# Patient Record
Sex: Female | Born: 1940 | Race: White | Hispanic: No | Marital: Married | Smoking: Former smoker
Health system: Southern US, Community
[De-identification: ages and names within clinical notes are randomized; demographics above are authoritative.]

## PROBLEM LIST (undated history)

## (undated) DIAGNOSIS — R93429 Abnormal radiologic findings on diagnostic imaging of unspecified kidney: Secondary | ICD-10-CM

## (undated) DIAGNOSIS — I214 Non-ST elevation (NSTEMI) myocardial infarction: Secondary | ICD-10-CM

## (undated) DIAGNOSIS — I3139 Other pericardial effusion (noninflammatory): Secondary | ICD-10-CM

## (undated) DIAGNOSIS — R7989 Other specified abnormal findings of blood chemistry: Secondary | ICD-10-CM

## (undated) DIAGNOSIS — I313 Pericardial effusion (noninflammatory): Secondary | ICD-10-CM

## (undated) DIAGNOSIS — I10 Essential (primary) hypertension: Secondary | ICD-10-CM

## (undated) DIAGNOSIS — Q2381 Bicuspid aortic valve: Secondary | ICD-10-CM

## (undated) DIAGNOSIS — Q231 Congenital insufficiency of aortic valve: Secondary | ICD-10-CM

## (undated) SURGERY — CORONARY ANGIOGRAM
Anesthesia: Moderate Sedation | Laterality: Left

---

## 2007-08-09 HISTORY — PX: CARDIAC CATHETERIZATION: SHX172

## 2008-02-01 ENCOUNTER — Encounter: Admission: RE | Admit: 2008-02-01 | Discharge: 2008-02-01 | Payer: Self-pay | Admitting: Obstetrics and Gynecology

## 2010-08-08 HISTORY — PX: REPLACEMENT TOTAL KNEE: SUR1224

## 2010-08-23 LAB — BASIC METABOLIC PANEL
BUN: 11 mg/dL (ref 6–23)
CO2: 29 mEq/L (ref 19–32)
Calcium: 9.3 mg/dL (ref 8.4–10.5)
Chloride: 107 mEq/L (ref 96–112)
Creatinine, Ser: 0.83 mg/dL (ref 0.4–1.2)
GFR calc Af Amer: >60 mL/min (ref >60)
GFR calc non Af Amer: >60 mL/min (ref >60)
Glucose, Bld: 87 mg/dL (ref 70–99)
Potassium: 4.6 mEq/L (ref 3.5–5.1)
Sodium: 143 mEq/L (ref 135–145)

## 2010-08-23 LAB — CBC
HCT: 42 % (ref 36.0–46.0)
Hemoglobin: 14.2 g/dL (ref 12.0–15.0)
MCH: 30.6 pg (ref 26.0–34.0)
MCHC: 33.8 g/dL (ref 30.0–36.0)
MCV: 90.5 fL (ref 78.0–100.0)
Platelets: 175 10*3/uL (ref 150–400)
RBC: 4.64 MIL/uL (ref 3.87–5.11)
RDW: 13.2 % (ref 11.5–15.5)
WBC: 9.3 10*3/uL (ref 4.0–10.5)

## 2010-08-23 LAB — SURGICAL PCR SCREEN
MRSA, PCR: NEGATIVE
Staphylococcus aureus: NEGATIVE

## 2010-08-23 LAB — APTT: aPTT: 26 seconds (ref 24–37)

## 2010-08-23 LAB — PROTIME-INR
INR: 0.99 (ref 0.00–1.49)
Prothrombin Time: 13.3 seconds (ref 11.6–15.2)

## 2010-08-24 ENCOUNTER — Inpatient Hospital Stay (HOSPITAL_COMMUNITY)
Admission: RE | Admit: 2010-08-24 | Discharge: 2010-08-28 | Payer: Self-pay | Source: Home / Self Care | Attending: Orthopaedic Surgery | Admitting: Orthopaedic Surgery

## 2010-08-30 LAB — CBC
HCT: 36.2 % (ref 36.0–46.0)
HCT: 33.6 % — ABNORMAL LOW (ref 36.0–46.0)
HCT: 34.2 % — ABNORMAL LOW (ref 36.0–46.0)
Hemoglobin: 12 g/dL (ref 12.0–15.0)
Hemoglobin: 11.1 g/dL — ABNORMAL LOW (ref 12.0–15.0)
Hemoglobin: 11.3 g/dL — ABNORMAL LOW (ref 12.0–15.0)
MCH: 29.8 pg (ref 26.0–34.0)
MCH: 29.4 pg (ref 26.0–34.0)
MCH: 29.4 pg (ref 26.0–34.0)
MCHC: 33.1 g/dL (ref 30.0–36.0)
MCHC: 33 g/dL (ref 30.0–36.0)
MCHC: 33 g/dL (ref 30.0–36.0)
MCV: 89.8 fL (ref 78.0–100.0)
MCV: 89.1 fL (ref 78.0–100.0)
MCV: 89.1 fL (ref 78.0–100.0)
Platelets: 140 10*3/uL — ABNORMAL LOW (ref 150–400)
Platelets: 146 10*3/uL — ABNORMAL LOW (ref 150–400)
Platelets: 155 10*3/uL (ref 150–400)
RBC: 4.03 MIL/uL (ref 3.87–5.11)
RBC: 3.77 MIL/uL — ABNORMAL LOW (ref 3.87–5.11)
RBC: 3.84 MIL/uL — ABNORMAL LOW (ref 3.87–5.11)
RDW: 13.4 % (ref 11.5–15.5)
RDW: 13.5 % (ref 11.5–15.5)
RDW: 13.4 % (ref 11.5–15.5)
WBC: 9.5 10*3/uL (ref 4.0–10.5)
WBC: 10.5 10*3/uL (ref 4.0–10.5)
WBC: 9.9 10*3/uL (ref 4.0–10.5)

## 2010-08-30 LAB — BASIC METABOLIC PANEL
BUN: 7 mg/dL (ref 6–23)
BUN: 13 mg/dL (ref 6–23)
BUN: 10 mg/dL (ref 6–23)
CO2: 28 mEq/L (ref 19–32)
CO2: 28 mEq/L (ref 19–32)
CO2: 29 mEq/L (ref 19–32)
Calcium: 8.2 mg/dL — ABNORMAL LOW (ref 8.4–10.5)
Calcium: 8.3 mg/dL — ABNORMAL LOW (ref 8.4–10.5)
Calcium: 8.1 mg/dL — ABNORMAL LOW (ref 8.4–10.5)
Chloride: 103 mEq/L (ref 96–112)
Chloride: 102 mEq/L (ref 96–112)
Chloride: 103 mEq/L (ref 96–112)
Creatinine, Ser: 0.79 mg/dL (ref 0.4–1.2)
Creatinine, Ser: 0.91 mg/dL (ref 0.4–1.2)
Creatinine, Ser: 0.72 mg/dL (ref 0.4–1.2)
GFR calc Af Amer: >60 mL/min (ref >60)
GFR calc Af Amer: >60 mL/min (ref >60)
GFR calc Af Amer: >60 mL/min (ref >60)
GFR calc non Af Amer: >60 mL/min (ref >60)
GFR calc non Af Amer: >60 mL/min (ref >60)
GFR calc non Af Amer: >60 mL/min (ref >60)
Glucose, Bld: 117 mg/dL — ABNORMAL HIGH (ref 70–99)
Glucose, Bld: 104 mg/dL — ABNORMAL HIGH (ref 70–99)
Glucose, Bld: 102 mg/dL — ABNORMAL HIGH (ref 70–99)
Potassium: 3.8 mEq/L (ref 3.5–5.1)
Potassium: 3.6 mEq/L (ref 3.5–5.1)
Potassium: 3.8 mEq/L (ref 3.5–5.1)
Sodium: 136 mEq/L (ref 135–145)
Sodium: 136 mEq/L (ref 135–145)
Sodium: 138 mEq/L (ref 135–145)

## 2010-08-30 LAB — PROTIME-INR
INR: 1.16 (ref 0.00–1.49)
INR: 2.31 — ABNORMAL HIGH (ref 0.00–1.49)
INR: 2.53 — ABNORMAL HIGH (ref 0.00–1.49)
Prothrombin Time: 15 seconds (ref 11.6–15.2)
Prothrombin Time: 25.5 seconds — ABNORMAL HIGH (ref 11.6–15.2)
Prothrombin Time: 27.4 seconds — ABNORMAL HIGH (ref 11.6–15.2)

## 2010-08-31 LAB — PROTIME-INR
INR: 2.1 — ABNORMAL HIGH (ref 0.00–1.49)
Prothrombin Time: 23.7 seconds — ABNORMAL HIGH (ref 11.6–15.2)

## 2010-09-02 NOTE — Op Note (Addendum)
NAME:  Madison Ramos, Madison Ramos                 ACCOUNT NO.:  [id number]  MEDICAL RECORD NO.:  [id number]          PATIENT TYPE:  INP  LOCATION:  5039                         FACILITY:  [facility]  PHYSICIAN:  Lubertha Basque. Cheyrl Buley, M.D.DATE OF BIRTH:  10-Oct-1940  DATE OF PROCEDURE:  08/24/2010 DATE OF DISCHARGE:                              OPERATIVE REPORT   PREOPERATIVE DIAGNOSIS:  Left knee degenerative joint disease.  POSTOPERATIVE DIAGNOSIS:  Left knee degenerative joint disease.  PROCEDURE:  Left total knee replacement.  ANESTHESIA:  General and block.  ATTENDING SURGEON:  Lubertha Basque. Jerl Santos, MD  ASSISTANT:  Lindwood Qua, PA   INDICATIONS FOR PROCEDURE:  The patient is a 70 year old woman with long history of bilateral knee degenerative arthritis.  She has had various oral anti-inflammatories and injectables and at this point has pain which limits her ability to rest and walk.  By x-ray, she has advanced degenerative change.  She is offered a knee replacement on the left, which is her most painful side.  Informed operative consent was obtained after discussion of possible complications including reaction to anesthesia, infection, DVT, PE, and death.  The importance of the postoperative rehabilitation protocol to optimize result was stressed with the patient.  SUMMARY OF FINDINGS AND PROCEDURE:  Under general anesthesia and a block, a left knee replacement was performed.  She had advanced degenerative change, but excellent bone quality.  We addressed the problem with cemented DePuy LCS parts using a standard plus femur, 4 tibia, 12.5 deep dish spacer, and 38 all-polyethylene patella.  It did include Zinacef antibiotic in the cement.  Bryna Colander assisted throughout and was invaluable to the completion of the case, in that, he helped position and retract while I performed the procedure.  He also closed simultaneously to help minimize OR time.  DESCRIPTION OF PROCEDURE:  The patient  was taken to an operating suite where general anesthetic was applied without difficulty.  She was also given a block in the preanesthesia area.  She was positioned in supine and prepped and draped in normal sterile fashion.  After administration of IV Kefzol, the left leg was elevated, exsanguinated, tourniquet inflated about the thigh.  A longitudinal anterior incision was made with dissection down the extensor mechanism.  All appropriate anti- infective measures were used including Betadine impregnated drape, closed hooded exhaust systems for each member of surgical team, and the preoperative IV antibiotic.  A medial parapatellar incision was made. The kneecap was flipped and the knee flexed.  Some residual meniscal tissues were removed along with the ACL and PCL.  An extramedullary guide was placed on the tibia to make a cut with a slight posterior tilt.  An intramedullary guide was then placed in the femur to make the anterior and posterior cuts creating a flexion gap of 12.5 mm.  A second intramedullary guide was placed in the femur to make a distal cut creating an equal extension gap of 12.5 mm balancing the knee.  The femur sized to a standard plus and the tibia to 4 and the appropriate guides were placed and utilized.  The patella was cut down to  thickness by about 10 mm to 15 and sized to a 38 with the appropriate guide placed and utilized.  A trial reduction was done with all these components and she easily came to full extension and flexed well.  The patella tracked well.  Trial components were removed followed by pulsatile lavage irrigation of all 3 cut bony surfaces.  Cement was mixed including Zinacef and was pressurized onto the bones followed by placement of aforementioned DePuy LCS components.  Excess cement was trimmed.  The pressure was held on the components until the cement had hardened.  The tourniquet was deflated and a small amount of bleeding was easily controlled  with Bovie cautery and pressure.  The knee was again irrigated followed by placement of a drain exiting superolaterally.  The extensor mechanism was reapproximated with #1 Vicryl in an interrupted fashion followed by subcutaneous reapproximation with 0 and 2-0 undyed Vicryl.  Skin was closed with staples.  Adaptic was applied followed by dry gauze and a loose Ace wrap.  Estimated blood loss and intraoperative fluids can be obtained from anesthesia records as can accurate tourniquet time.  DISPOSITION:  The patient was extubated in the operating room and taken to recovery room in stable addition.  She was to be admitted to the Orthopedic Surgery Service for appropriate postop care to include perioperative antibiotics and Coumadin plus Lovenox for DVT prophylaxis.     Lubertha Basque Jerl Santos, M.D.     PGD/MEDQ  D:  08/24/2010  T:  08/24/2010  Job:  [id number]  Electronically Signed by Marcene Corning M.D. on 09/02/2010 03:27:57 PM

## 2010-09-02 NOTE — Discharge Summary (Addendum)
NAME:  [name], [name]                 ACCOUNT NO.:  [id number]  MEDICAL RECORD NO.:  [id number]          PATIENT TYPE:  INP  LOCATION:  5039                         FACILITY:  [facility]  PHYSICIAN:  Lubertha Basque. Melaya Hoselton, M.D.DATE OF BIRTH:  02-17-1941  DATE OF ADMISSION:  08/24/2010 DATE OF DISCHARGE:                              DISCHARGE SUMMARY   ADMITTING DIAGNOSES: 1. Left knee end-stage degenerative joint disease. 2. History of hypertension. 3. History of hypercholesterolemia.  DISCHARGE DIAGNOSES: 1. Left knee end-stage degenerative joint disease. 2. History of hypertension. 3. History of hypercholesterolemia.Marland Kitchen  OPERATIONS:  Left total knee replacement.  BRIEF HISTORY:  [name] is a 70 year old white female patient well- known to our practice who is having left knee pain increasing with ambulation or sleep.  She has failed Supartz and then corticosteroid injections and other anti-inflammatory medicines including Mobic.  X- rays reveal end-stage DJD in her left knee.  PERTINENT LABORATORY AND X-RAY FINDINGS:  Hemoglobin 11.1, hematocrit 33.6, and platelets 146.  Sodium 136, potassium 3.6, BUN 13, creatinine 0.91, and glucose 104.  INR 2.31.  COURSE IN HOSPITAL:  She was admitted postoperatively and the total joint protocol set order sheets were used.  She has a advance diet as tolerated.  Implemented total joint protocol replacement orders and implemented orthopedic p.r.n. orders.  Lovenox, Coumadin per pharmacy prophylaxis.  Knee-high TEDs, incentive spirometry, and then increased activity with the CPM and physical therapy.  Weightbearing as tolerated. She was given appropriate pain medicines, antiemetics, antispasmodics and covered with perioperative antibiotics, Ancef 1 gram daily x3 doses, and then followup lab studies mentioned above.  Nasal cannula O2 was used as well perioperatively.  First day postop, she had normal vital signs.  Lungs were clear.  Abdomen was  soft.  Left knee was moving in a CPM machine with a drain in place.  Foley catheter discontinued.  Second day postop, her vital signs, temperature 99, blood pressure 93/52, hemoglobin 11.1, INR 2.31, abdomen soft, no bowel movements, and lungs were clear.  Dressing was changed.  Wound was noted to be benign.  No sign of infection, irritation, and normal neurovascular status.  Home arrangements would be best with family members being able to be home with her 24/7 as of Saturday and so discharge was planned for that date. She had worked well with physical therapy and made it to the clock in the hallway which is probably 20 feet without significant difficulty. Physical therapy was going to continue working with her.  CONDITION ON DISCHARGE:  Improved.  DISCHARGE INSTRUCTIONS:  She will remain on a low-sodium, heart-healthy diet.  May change her dressing daily.  Continue on Coumadin for 2 weeks postop with INRs being checked by [facility].  [facility] will also provide CPM machine, physical therapy, weightbearing as tolerated, and then appropriate equipment.  Return to the office in 2 weeks from the surgical date for staple removal.  Any sign of infection to call office, [phone], we would see her immediately.  Continue with home incentive spirometry, knee-high TEDs as well.     Lindwood Qua, P.A.   ______________________________  Lubertha Basque Jerl Santos, M.D.    MC/MEDQ  D:  08/26/2010  T:  08/27/2010  Job:  [id number]  Electronically Signed by Lindwood Qua P.A. on 08/31/2010 02:58:34 PM Electronically Signed by Marcene Corning M.D. on 09/02/2010 03:28:02 PM

## 2011-11-20 ENCOUNTER — Inpatient Hospital Stay (HOSPITAL_COMMUNITY)
Admission: AD | Admit: 2011-11-20 | Discharge: 2011-11-22 | DRG: 281 | Disposition: A | Discharge status: Home / Self Care | Payer: Medicare PPO | Source: Other Acute Inpatient Hospital | Attending: Internal Medicine | Admitting: Internal Medicine

## 2011-11-20 ENCOUNTER — Encounter (HOSPITAL_COMMUNITY): Payer: Self-pay | Admitting: Internal Medicine

## 2011-11-20 ENCOUNTER — Encounter (HOSPITAL_COMMUNITY)
Admission: AD | Disposition: A | Discharge status: Home / Self Care | Payer: Self-pay | Source: Other Acute Inpatient Hospital | Attending: Internal Medicine

## 2011-11-20 ENCOUNTER — Inpatient Hospital Stay (HOSPITAL_COMMUNITY): Payer: Medicare PPO

## 2011-11-20 DIAGNOSIS — I319 Disease of pericardium, unspecified: Secondary | ICD-10-CM

## 2011-11-20 DIAGNOSIS — Q231 Congenital insufficiency of aortic valve: Secondary | ICD-10-CM

## 2011-11-20 DIAGNOSIS — R079 Chest pain, unspecified: Secondary | ICD-10-CM

## 2011-11-20 DIAGNOSIS — E785 Hyperlipidemia, unspecified: Secondary | ICD-10-CM | POA: Diagnosis present

## 2011-11-20 DIAGNOSIS — E876 Hypokalemia: Secondary | ICD-10-CM | POA: Diagnosis present

## 2011-11-20 DIAGNOSIS — I359 Nonrheumatic aortic valve disorder, unspecified: Secondary | ICD-10-CM

## 2011-11-20 DIAGNOSIS — I10 Essential (primary) hypertension: Secondary | ICD-10-CM

## 2011-11-20 DIAGNOSIS — I214 Non-ST elevation (NSTEMI) myocardial infarction: Principal | ICD-10-CM

## 2011-11-20 HISTORY — DX: Other specified abnormal findings of blood chemistry: R79.89

## 2011-11-20 HISTORY — DX: Abnormal radiologic findings on diagnostic imaging of unspecified kidney: R93.429

## 2011-11-20 HISTORY — DX: Bicuspid aortic valve: Q23.81

## 2011-11-20 HISTORY — DX: Other pericardial effusion (noninflammatory): I31.39

## 2011-11-20 HISTORY — PX: LEFT HEART CATHETERIZATION WITH CORONARY ANGIOGRAM: SHX5451

## 2011-11-20 HISTORY — DX: Non-ST elevation (NSTEMI) myocardial infarction: I21.4

## 2011-11-20 HISTORY — DX: Congenital insufficiency of aortic valve: Q23.1

## 2011-11-20 HISTORY — DX: Pericardial effusion (noninflammatory): I31.3

## 2011-11-20 HISTORY — DX: Essential (primary) hypertension: I10

## 2011-11-20 LAB — BASIC METABOLIC PANEL
Chloride: 105 mEq/L (ref 96–112)
GFR calc Af Amer: >90 mL/min (ref >90)
GFR calc non Af Amer: 89 mL/min — ABNORMAL LOW (ref >90)
Potassium: 3 mEq/L — ABNORMAL LOW (ref 3.5–5.1)
Sodium: 140 mEq/L (ref 135–145)

## 2011-11-20 LAB — CBC
HCT: 40.6 % (ref 36.0–46.0)
MCHC: 35.2 g/dL (ref 30.0–36.0)
Platelets: 179 10*3/uL (ref 150–400)
RDW: 13 % (ref 11.5–15.5)
WBC: 14.1 10*3/uL — ABNORMAL HIGH (ref 4.0–10.5)

## 2011-11-20 LAB — MRSA PCR SCREENING: MRSA by PCR: NEGATIVE

## 2011-11-20 LAB — D-DIMER, QUANTITATIVE: D-Dimer, Quant: 0.57 ug/mL-FEU — ABNORMAL HIGH (ref 0.00–0.48)

## 2011-11-20 SURGERY — LEFT HEART CATHETERIZATION WITH CORONARY ANGIOGRAM
Anesthesia: LOCAL

## 2011-11-20 MED ORDER — ACETAMINOPHEN 325 MG PO TABS: 650.0000 mg | ORAL_TABLET | ORAL | Status: DC | PRN

## 2011-11-20 MED ORDER — DIAZEPAM 2 MG PO TABS: 2.0000 mg | ORAL_TABLET | Freq: Four times a day (QID) | ORAL | Status: DC | PRN

## 2011-11-20 MED ORDER — LIDOCAINE HCL (PF) 1 % IJ SOLN
INTRAMUSCULAR | Status: AC
  Filled 2011-11-20: qty 30

## 2011-11-20 MED ORDER — ONDANSETRON HCL 4 MG/2ML IJ SOLN: 4.0000 mg | Freq: Four times a day (QID) | INTRAMUSCULAR | Status: DC | PRN

## 2011-11-20 MED ORDER — HEPARIN (PORCINE) IN NACL 2-0.9 UNIT/ML-% IJ SOLN
INTRAMUSCULAR | Status: AC
  Filled 2011-11-20: qty 2000

## 2011-11-20 MED ORDER — MORPHINE SULFATE 2 MG/ML IJ SOLN
2.0000 mg | Freq: Once | INTRAMUSCULAR | Status: AC
  Administered 2011-11-20: 2 mg via INTRAVENOUS

## 2011-11-20 MED ORDER — CARVEDILOL 3.125 MG PO TABS
3.1250 mg | ORAL_TABLET | Freq: Two times a day (BID) | ORAL | Status: DC
  Filled 2011-11-20 (×2): qty 1

## 2011-11-20 MED ORDER — POTASSIUM CHLORIDE CRYS ER 20 MEQ PO TBCR
40.0000 meq | EXTENDED_RELEASE_TABLET | Freq: Once | ORAL | Status: AC
  Administered 2011-11-20: 40 meq via ORAL
  Filled 2011-11-20: qty 2

## 2011-11-20 MED ORDER — SODIUM CHLORIDE 0.9 % IV SOLN: 1.0000 mL/kg/h | INTRAVENOUS | Status: DC

## 2011-11-20 MED ORDER — NITROGLYCERIN 0.2 MG/ML ON CALL CATH LAB
INTRAVENOUS | Status: AC
  Filled 2011-11-20: qty 1

## 2011-11-20 MED ORDER — NITROGLYCERIN IN D5W 200-5 MCG/ML-% IV SOLN
2.0000 ug/min | INTRAVENOUS | Status: DC
  Administered 2011-11-20: 5 ug/min via INTRAVENOUS

## 2011-11-20 MED ORDER — SODIUM CHLORIDE 0.9 % IV SOLN
INTRAVENOUS | Status: DC
  Administered 2011-11-20: 20 mL/h via INTRAVENOUS

## 2011-11-20 MED ORDER — SODIUM CHLORIDE 0.9 % IJ SOLN: 3.0000 mL | INTRAMUSCULAR | Status: DC | PRN

## 2011-11-20 MED ORDER — HEPARIN SODIUM (PORCINE) 1000 UNIT/ML IJ SOLN
INTRAMUSCULAR | Status: AC
  Filled 2011-11-20: qty 1

## 2011-11-20 MED ORDER — SODIUM CHLORIDE 0.9 % IJ SOLN: 3.0000 mL | Freq: Two times a day (BID) | INTRAMUSCULAR | Status: DC

## 2011-11-20 MED ORDER — ENOXAPARIN SODIUM 40 MG/0.4ML ~~LOC~~ SOLN
40.0000 mg | Freq: Every day | SUBCUTANEOUS | Status: DC
  Administered 2011-11-21 (×2): 40 mg via SUBCUTANEOUS
  Filled 2011-11-20 (×3): qty 0.4

## 2011-11-20 MED ORDER — VERAPAMIL HCL 2.5 MG/ML IV SOLN
INTRAVENOUS | Status: AC
  Filled 2011-11-20: qty 2

## 2011-11-20 MED ORDER — SIMVASTATIN 20 MG PO TABS
20.0000 mg | ORAL_TABLET | Freq: Every day | ORAL | Status: DC
  Administered 2011-11-20 – 2011-11-22 (×3): 20 mg via ORAL
  Filled 2011-11-20 (×3): qty 1

## 2011-11-20 MED ORDER — FENTANYL CITRATE 0.05 MG/ML IJ SOLN
INTRAMUSCULAR | Status: AC
  Filled 2011-11-20: qty 2

## 2011-11-20 MED ORDER — MIDAZOLAM HCL 2 MG/2ML IJ SOLN
INTRAMUSCULAR | Status: AC
  Filled 2011-11-20: qty 2

## 2011-11-20 MED ORDER — ATORVASTATIN CALCIUM 80 MG PO TABS
80.0000 mg | ORAL_TABLET | Freq: Every day | ORAL | Status: DC
  Filled 2011-11-20: qty 1

## 2011-11-20 MED ORDER — CARVEDILOL 3.125 MG PO TABS
3.1250 mg | ORAL_TABLET | Freq: Two times a day (BID) | ORAL | Status: DC
  Administered 2011-11-20 – 2011-11-22 (×5): 3.125 mg via ORAL
  Filled 2011-11-20 (×7): qty 1

## 2011-11-20 MED ORDER — ASPIRIN 300 MG RE SUPP
300.0000 mg | RECTAL | Status: DC
  Filled 2011-11-20: qty 1

## 2011-11-20 MED ORDER — SODIUM CHLORIDE 0.9 % IV SOLN: 250.0000 mL | INTRAVENOUS | Status: DC | PRN

## 2011-11-20 MED ORDER — CLOPIDOGREL BISULFATE 75 MG PO TABS: 75.0000 mg | ORAL_TABLET | Freq: Every day | ORAL | Status: DC

## 2011-11-20 MED ORDER — ASPIRIN 81 MG PO CHEW: 324.0000 mg | CHEWABLE_TABLET | ORAL | Status: DC

## 2011-11-20 MED ORDER — SODIUM CHLORIDE 0.9 % IV SOLN: INTRAVENOUS | Status: AC

## 2011-11-20 MED ORDER — NITROGLYCERIN 0.4 MG SL SUBL: 0.4000 mg | SUBLINGUAL_TABLET | SUBLINGUAL | Status: DC | PRN

## 2011-11-20 MED ORDER — ASPIRIN EC 81 MG PO TBEC
81.0000 mg | DELAYED_RELEASE_TABLET | Freq: Every day | ORAL | Status: DC
  Administered 2011-11-21: 81 mg via ORAL
  Filled 2011-11-20: qty 1

## 2011-11-20 NOTE — Progress Notes (Signed)
CRITICAL VALUE ALERT  Critical value received:  Trop & ckmb  Date of notification:  11/20/2011  Time of notification:  2050  Critical value read back:ckmb 16.3 Trop 10.94  Nurse who received alert:  [place] / Emilie Rutter  MD notified (1st page):  [facility] PA  Time of first page:  2056  MD notified (2nd page):  Time of second page:  Responding MD:  Berton Mount PA  Time MD responded:  2057

## 2011-11-20 NOTE — Progress Notes (Signed)
71yo female post-cath for NSTEMI with no significant coronary obstruction, awaiting Echo, holding off on heparin for now, to begin Lovenox for DVT Px immediately.  Will begin Lovenox 40mg  SQ Q24H for wt 75kg and CrCl ~41ml/min and continue to monitor.  Vernard Gambles, PharmD, BCPS 11/20/2011 11:56 PM

## 2011-11-20 NOTE — Progress Notes (Signed)
Dr. Riley Kill came to bedside to check on pt.  Notified him of D-dimer result.  No new orders.

## 2011-11-20 NOTE — Progress Notes (Signed)
Dr. Riley Kill on floor and notified that potassium level 3.0.  Orders received for 40 meq potassium now and additional 40 meq potassium in 2 hours.  Will continue to monitor.

## 2011-11-20 NOTE — H&P (View-Only) (Signed)
  HPI:  71 y/o woman with h/o HTN, HL transferred from [facility] for NSTEMI.  Denies h/o previous MI. Had cath for CP in 2009 at [facility]. Reportedly showed 40% lesion. Under a lot of stress lately and recently treated for URI.  This am woke with severe CP at 1a radiating to arm and epigastrum. Went to [facility]. ECG new RBBB. 1st set CE negative. F/u CE with Trop 11. D-dimer normal. BP high but SBP dropped to 90 with sl NTG.  Started heparin, asa, plavix 600. Transferred here. SBP 160. Still with CP 4/10. Ecg with sinus brady 56. Inferior TWI.  No recent bleeding.    Review of Systems:     Cardiac Review of Systems: {Y] = yes [ ] = no  Chest Pain [y    ]  Resting SOB [   ] Exertional SOB  [  ]  Orthopnea [  ]   Pedal Edema [   ]    Palpitations [  ] Syncope  [  ]   Presyncope [   ]  General Review of Systems: [Y] = yes [  ]=no Constitional: recent weight change [  ]; anorexia [  ]; fatigue [  ]; nausea [  ]; night sweats [  ]; fever [  ]; or chills [  ];                                                                                                                                          Dental: poor dentition[  ];   Eye : blurred vision [  ]; diplopia [   ]; vision changes [  ];  Amaurosis fugax[  ]; Resp: cough [  ];  wheezing[  ];  hemoptysis[  ]; shortness of breath[  ]; paroxysmal nocturnal dyspnea[  ]; dyspnea on exertion[  ]; or orthopnea[  ];  GI:  gallstones[  ], vomiting[  ];  dysphagia[  ]; melena[  ];  hematochezia [  ]; heartburn[  ];   GU: kidney stones [  ]; hematuria[  ];   dysuria [  ];  nocturia[  ];  history of     obstruction [  ];                 Skin: rash, swelling[  ];, hair loss[  ];  peripheral edema[  ];  or itching[  ]; Musculosketetal: myalgias[  ];  joint swelling[  ];  joint erythema[  ];  joint pain[  ];  back pain[  ];  Heme/Lymph: bruising[  ];  bleeding[  ];  anemia[  ];  Neuro: TIA[  ];  headaches[  ];  stroke[  ];  vertigo[  ];  seizures[   ];   paresthesias[  ];  difficulty walking[  ];  Psych:depression[  ]; anxiety[  ];  Endocrine: diabetes[  ];    thyroid dysfunction[  ];  Other:  Past Medical History  Diagnosis Date  . HTN (hypertension)   . Chest pain     Cath 2009 at [facility]. 40% stenosis    Medications Prior to Admission  Medication Dose Route Frequency Provider Last Rate Last Dose  . 0.9 %  sodium chloride infusion   Intravenous Continuous Mordechai Matuszak R Khadim Lundberg, MD 20 mL/hr at 11/20/11 0937 20 mL/hr at 11/20/11 0937  . morphine 2 MG/ML injection 2 mg  2 mg Intravenous Once Vihana Kydd R Caspar Favila, MD   2 mg at 11/20/11 0930  . nitroGLYCERIN 0.2 mg/mL in dextrose 5 % infusion  2-200 mcg/min Intravenous Titrated Aracelia Brinson R Daron Breeding, MD 1.5 mL/hr at 11/20/11 0930 5 mcg/min at 11/20/11 0930   Medications Prior to Admission  Medication Sig Dispense Refill  . carvedilol (COREG) 3.125 MG tablet Take 3.125 mg by mouth 2 (two) times daily with a meal.      . simvastatin (ZOCOR) 20 MG tablet Take 20 mg by mouth every evening.         No Known Allergies  History   Social History  . Marital Status: Married    Spouse Name: N/A    Number of Children: N/A  . 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 Smoking status: Never Smoker   . Smokeless tobacco: Not on file  . Alcohol Use: No  . Drug Use: No  . Sexually Active: Not on file   Other Topics Concern  . Not on file   Social History Narrative  . No narrative on file    Family History  Problem Relation Age of Onset  . Rheumatic fever Father   Father and mother bother deceased. Father smoked and drank and apparently had rheumatic heart disease. No premature CAD.  PHYSICAL EXAM: AF HR 56 BP 160/88  Sats 98% 2L General:  Tearful. No respiratory difficulty HEENT: normal Neck: supple. no JVD. Carotids 2+ bilat; no bruits. No lymphadenopathy or thryomegaly appreciated. Cor: PMI nondisplaced. Regular rate & rhythm.  No rubs, gallops, 2/6 SEM RSB Lungs: clear Abdomen: soft, nontender, nondistended. No hepatosplenomegaly. No bruits or masses. Good bowel sounds. Extremities: no cyanosis, clubbing, rash, edema Neuro: alert & oriented x 3, cranial nerves grossly intact. moves all 4 extremities w/o difficulty. Affect pleasant.  ECG: SR with inferolateral TWI -> SB 56 inferior TWI (lateral changes improved)  Labs from [place]: wbc 11  hgb 14.7 hct 43.9 plt 173 INR 1.03.  Sodium 143 K 3.6 BUN 21 Cr 1.0  Ck 263 MB 35 Trop 11.2  ASSESSMENT: 1. NSTEMI 2. HTN 3. Recent URI 4. Cath 2009 ([place] 40% blockage  PLAN/DISCUSSION:  She has positive cardiac markers with ongoing CP. Have d/w Dr. Stuckey. Will take to cath lab urgently. Treat with ASA, Plavix (they did not have Effient at [place]), carvedilol.   Teresita Fanton,MD 9:39 AM   

## 2011-11-20 NOTE — Progress Notes (Signed)
Chaplain responded to a page for an urgent catherization for the patient. Chaplain provided psychological care to the patient and her family. Chaplain escorted family to visit patient before her procedure. No follow up needed.

## 2011-11-20 NOTE — Progress Notes (Signed)
  Echocardiogram 2D Echocardiogram has been performed.  Madison Ramos 11/20/2011, 2:53 PM

## 2011-11-20 NOTE — Progress Notes (Addendum)
HPI:  71 y/o woman with h/o HTN, HL transferred from [facility] for NSTEMI.  Denies h/o previous MI. Had cath for CP in 2009 at [facility]. Reportedly showed 40% lesion. Under a lot of stress lately and recently treated for URI.  This am woke with severe CP at 1a radiating to arm and epigastrum. Went to [facility]. ECG new RBBB. 1st set CE negative. F/u CE with Trop 11. D-dimer normal. BP high but SBP dropped to 90 with sl NTG.  Started heparin, asa, plavix 600. Transferred here. SBP 160. Still with CP 4/10. Ecg with sinus brady 56. Inferior TWI.  No recent bleeding.    Review of Systems:     Cardiac Review of Systems: {Y] = yes [ ]  = no  Chest Pain [place]ienne    ]  Resting SOB [   ] Exertional SOB  [  ]  Orthopnea [  ]   Pedal Edema [   ]    Palpitations [  ] Syncope  [  ]   Presyncope [   ]  General Review of Systems: [Y] = yes [  ]=no Constitional: recent weight change [  ]; anorexia [  ]; fatigue [  ]; nausea [  ]; night sweats [  ]; fever [  ]; or chills [  ];                                                                                                                                          Dental: poor dentition[  ];   Eye : blurred vision [  ]; diplopia [   ]; vision changes [  ];  Amaurosis fugax[  ]; Resp: cough [  ];  wheezing[  ];  hemoptysis[  ]; shortness of breath[  ]; paroxysmal nocturnal dyspnea[  ]; dyspnea on exertion[  ]; or orthopnea[  ];  GI:  gallstones[  ], vomiting[  ];  dysphagia[  ]; melena[  ];  hematochezia [  ]; heartburn[  ];   GU: kidney stones [  ]; hematuria[  ];   dysuria [  ];  nocturia[  ];  history of     obstruction [  ];                 Skin: rash, swelling[  ];, hair loss[  ];  peripheral edema[  ];  or itching[  ]; Musculosketetal: myalgias[  ];  joint swelling[  ];  joint erythema[  ];  joint pain[  ];  back pain[  ];  Heme/Lymph: bruising[  ];  bleeding[  ];  anemia[  ];  Neuro: TIA[  ];  headaches[  ];  stroke[  ];  vertigo[  ];  seizures[   ];   paresthesias[  ];  difficulty walking[  ];  Psych:depression[  ]; anxiety[  ];  Endocrine: diabetes[  ];  thyroid dysfunction[  ];  Other:  Past Medical History  Diagnosis Date  . HTN (hypertension)   . Chest pain     Cath 2009 at [facility]. 40% stenosis    Medications Prior to Admission  Medication Dose Route Frequency Provider Last Rate Last Dose  . 0.9 %  sodium chloride infusion   Intravenous Continuous Dolores Patty, MD 20 mL/hr at 11/20/11 0937 20 mL/hr at 11/20/11 0937  . morphine 2 MG/ML injection 2 mg  2 mg Intravenous Once Dolores Patty, MD   2 mg at 11/20/11 0930  . nitroGLYCERIN 0.2 mg/mL in dextrose 5 % infusion  2-200 mcg/min Intravenous Titrated Dolores Patty, MD 1.5 mL/hr at 11/20/11 0930 5 mcg/min at 11/20/11 0930   Medications Prior to Admission  Medication Sig Dispense Refill  . carvedilol (COREG) 3.125 MG tablet Take 3.125 mg by mouth 2 (two) times daily with a meal.      . simvastatin (ZOCOR) 20 MG tablet Take 20 mg by mouth every evening.         No Known Allergies  History   Social History  . Marital Status: Married    Spouse Name: N/A    Number of Children: N/A  . Years of Education: N/A   Occupational History  . Buyer, retail   Social History Main Topics  . Smoking status: Never Smoker   . Smokeless tobacco: Not on file  . Alcohol Use: No  . Drug Use: No  . Sexually Active: Not on file   Other Topics Concern  . Not on file   Social History Narrative  . No narrative on file    Family History  Problem Relation Age of Onset  . Rheumatic fever Father   Father and mother bother deceased. Father smoked and drank and apparently had rheumatic heart disease. No premature CAD.  PHYSICAL EXAM: AF HR 56 BP 160/88  Sats 98% 2L General:  Tearful. No respiratory difficulty HEENT: normal Neck: supple. no JVD. Carotids 2+ bilat; no bruits. No lymphadenopathy or thryomegaly appreciated. Cor: PMI nondisplaced. Regular rate & rhythm.  No rubs, gallops, 2/6 SEM RSB Lungs: clear Abdomen: soft, nontender, nondistended. No hepatosplenomegaly. No bruits or masses. Good bowel sounds. Extremities: no cyanosis, clubbing, rash, edema Neuro: alert & oriented x 3, cranial nerves grossly intact. moves all 4 extremities w/o difficulty. Affect pleasant.  ECG: SR with inferolateral TWI -> SB 56 inferior TWI (lateral changes improved)  Labs from [facility]: wbc 11  hgb 14.7 hct 43.9 plt 173 INR 1.03.  Sodium 143 K 3.6 BUN 21 Cr 1.0  Ck 263 MB 35 Trop 11.2  ASSESSMENT: 1. NSTEMI 2. HTN 3. Recent URI 4. Cath 2009 ([place] 40% blockage  PLAN/DISCUSSION:  She has positive cardiac markers with ongoing CP. Have d/w Dr. Riley Kill. Will take to cath lab urgently. Treat with ASA, Plavix (they did not have Effient at [facility]), carvedilol.   Truman Hayward 9:39 AM

## 2011-11-20 NOTE — Interval H&P Note (Signed)
History and Physical Interval Note:  11/20/2011 10:16 AM  Madison Ramos  has presented today for surgery, with the diagnosis of non STEMI.    The various methods of treatment have been discussed with the patient. After consideration of risks, benefits and other options for treatment, the patient has consented to  Procedure(s) (LRB): LEFT HEART CATHETERIZATION WITH CORONARY ANGIOGRAM (N/A) as a surgical intervention .  The patients' history has been reviewed, patient examined, no change in status, stable for surgery.  I have reviewed the patients' chart and labs.  Questions were answered to the patient's satisfaction.  Review of ECG suggests probably an MI with spontaneous reperfusion as characterized by rhythm strips.  She now has T wave inversion inferiorly consistent with this possibility.  She has residual chest pain suggesting ongoing ischemia, and therefore cardiac cath is indicated.  Access routes discussed.    Shawnie Pons 10:23 AM 11/20/2011       Shawnie Pons

## 2011-11-20 NOTE — CV Procedure (Signed)
   Cardiac Catheterization Procedure Note  Name: Madison Ramos MRN: [id number] DOB: Sep 10, 1940  Procedure: Left Heart Cath, Selective Coronary Angiography, LV angiography  Indication: Chest pain with positive troponin, inferior T inversion, and reperfusion arrythmia.  SEM on exam with early diastolic blow.     Procedural Details: The right wrist was prepped, draped, and anesthetized with 1% lidocaine. Using the modified Seldinger technique, a 5 French sheath was introduced into the right radial artery. 3 mg of verapamil was administered through the sheath, weight-based unfractionated heparin was administered intravenously. Standard Judkins catheters were used for selective coronary angiography. We could not easily cross the valve, so aortic root aortography was performed in light of absence of coronary obstruction.  It had the appearance of eccentric opening, with AI, suggesting possibly a bicuspid aortic valve with enlarged aortic root.  Catheter exchanges were performed over an exchange length guidewire. There were no immediate procedural complications. A TR band was used for radial hemostasis at the completion of the procedure.  The patient was transferred to the post catheterization recovery area for further monitoring.  Procedural Findings: Hemodynamics: AO 125/70 (92) LV  Not crossed  Coronary angiography: Coronary dominance: right  Left mainstem: Short, large in caliber, and free of significant disease.   Left anterior descending (LAD): Provides one major septal and diagonal.  The diagonal and LAD are both tortuous distally, but without critical narrowing.  They are moderately large in caliber.  The septal also appears normal.    Left circumflex (LCx): Provides one large caliber OM and an AV circ.  No significant obstruction.  Right coronary artery (RCA): Dominant vessel with long PDA, and small PLA system.  Left ventriculography: Not performed.  Unable to cross valve easily.    Aortic root:  Suggests possibly bicuspid aortic valve.  The proximal aorta appears enlarged.  There is probably moderate aortic regurgitation, although mild cannot be excluded.    Final Conclusions:    1.  No significant high grade coronary obstruction to explain findings. 2.  Aortic regurgitation, with reduced valve mobility  (?biscupid), and enlarged aortic root.  3.  No coronary calcification by fluoro.    Recommendations:  1.  Hold heparin for now 2.  DC plavix. 3.  Echo with possible CTA  Case reviewed with Dr. Gala Romney.  Shawnie Pons MD, [facility], [place] 11:43 AM 11/20/2011    Shawnie Pons 11/20/2011, 11:32 AM

## 2011-11-21 ENCOUNTER — Inpatient Hospital Stay (HOSPITAL_COMMUNITY): Payer: Medicare PPO

## 2011-11-21 DIAGNOSIS — I10 Essential (primary) hypertension: Secondary | ICD-10-CM

## 2011-11-21 DIAGNOSIS — I214 Non-ST elevation (NSTEMI) myocardial infarction: Secondary | ICD-10-CM

## 2011-11-21 LAB — BASIC METABOLIC PANEL
BUN: 18 mg/dL (ref 6–23)
CO2: 26 mEq/L (ref 19–32)
Chloride: 105 mEq/L (ref 96–112)
Glucose, Bld: 94 mg/dL (ref 70–99)
Potassium: 4 mEq/L (ref 3.5–5.1)
Sodium: 138 mEq/L (ref 135–145)

## 2011-11-21 LAB — LIPID PANEL
Cholesterol: 158 mg/dL (ref 0–200)
LDL Cholesterol: 61 mg/dL (ref 0–99)
Triglycerides: 266 mg/dL — ABNORMAL HIGH (ref <150)

## 2011-11-21 LAB — CARDIAC PANEL(CRET KIN+CKTOT+MB+TROPI)
CK, MB: 8.2 ng/mL (ref 0.3–4.0)
Relative Index: INVALID (ref 0.0–2.5)
Relative Index: INVALID (ref 0.0–2.5)
Troponin I: 8.36 ng/mL (ref <0.30)
Troponin I: 7.55 ng/mL (ref <0.30)

## 2011-11-21 LAB — CBC
HCT: 42.9 % (ref 36.0–46.0)
Hemoglobin: 14.7 g/dL (ref 12.0–15.0)
MCHC: 34.3 g/dL (ref 30.0–36.0)
RBC: 5.07 MIL/uL (ref 3.87–5.11)
WBC: 10.3 10*3/uL (ref 4.0–10.5)

## 2011-11-21 MED ORDER — ASPIRIN EC 81 MG PO TBEC
81.0000 mg | DELAYED_RELEASE_TABLET | Freq: Every day | ORAL | Status: DC
  Administered 2011-11-22: 81 mg via ORAL
  Filled 2011-11-21: qty 1

## 2011-11-21 NOTE — Progress Notes (Signed)
Patient arrived to unit at 1352. Placed on telemetry. Oriented to the unit.  V/s stable.  Will continue to monitor.

## 2011-11-21 NOTE — Progress Notes (Signed)
Patient Name: Madison Ramos Date of Encounter: 11/21/2011  Active Problems:  HTN (hypertension)  NSTEMI (non-ST elevated myocardial infarction)    SUBJECTIVE: No chest pain, no SOB, feels much better.    OBJECTIVE  Filed Vitals:   11/21/11 0600 11/21/11 0603 11/21/11 0700 11/21/11 0800  BP: 126/69  112/64   Pulse: 59  59   Temp:    97.7 F (36.5 C)  TempSrc:    Oral  Resp: 12  13   Height:      Weight:  167 lb 15.9 oz (76.2 kg)    SpO2: 97%  93%     Intake/Output Summary (Last 24 hours) at 11/21/11 0927 Last data filed at 11/21/11 0700  Gross per 24 hour  Intake 1153.5 ml  Output    850 ml  Net  303.5 ml   Weight change:  Wt Readings from Last 3 Encounters:  11/21/11 167 lb 15.9 oz (76.2 kg)  11/21/11 167 lb 15.9 oz (76.2 kg)     PHYSICAL EXAM  General: Well developed, well nourished, in no acute distress. Head: Normocephalic, atraumatic.  Neck: Supple without bruits, JVD not elevated. Lungs:  Resp regular and unlabored, CTA. Heart: RRR, S1, S2, no S3, S4, or murmurs. Abdomen: Soft, non-tender, non-distended, BS + x 4.  Extremities: No clubbing, cyanosis, no LE edema. Right radial cath site with some ecchymosis and slight edema but no bruit Neuro: Alert and oriented X 3. Moves all extremities spontaneously. Psych: Normal affect.  LABS:  CBC: Basename 11/21/11 0555 11/20/11 1311  WBC 10.3 14.1*  NEUTROABS -- --  HGB 14.7 14.3  HCT 42.9 40.6  MCV 84.6 83.4  PLT 151 179   Basic Metabolic Panel: Basename 11/21/11 0555 11/20/11 1311  NA 138 140  K 4.0 3.0*  CL 105 105  CO2 26 26  GLUCOSE 94 97  BUN 18 13  CREATININE 0.83 0.64  CALCIUM 8.4 8.1*  MG -- --  PHOS -- --   Cardiac Enzymes: Basename 11/21/11 0555 11/20/11 1939  CKTOTAL 67 129  CKMB 8.2* 16.3*  CKMBINDEX -- --  TROPONINI 8.36* 10.94*   D-dimer: Basename 11/20/11 1103  DDIMER 0.57*   Fasting Lipid Panel: Basename 11/21/11 0555  CHOL 158  HDL 44  LDLCALC 61  TRIG 266*    CHOLHDL 3.6  LDLDIRECT --    Echo: 11/20/2011 Study Conclusions - Left ventricle: The cavity size was normal. There was mild focal basal hypertrophy of the septum. Systolic function was normal. The estimated ejection fraction was in the range of 55% to 60%. Wall motion was normal; there were no regional wall motion abnormalities. Doppler parameters are consistent with abnormal left ventricular relaxation (grade 1 diastolic dysfunction). - Aortic valve: Functionally bicuspid. There is fusion of right and left coronary cusps with functionally bicuspid aortic valve. There was very mild stenosis. Mild  regurgitation. Valve area: 2.4cm^2(VTI). Valve area: 1.69cm^2 (Vmax). - Pericardium, extracardiac: A small pericardial effusion was identified along the right atrial free wall. There was no evidence of hemodynamic compromise.   TELE:    SR  Radiology/Studies: Portable Chest X-ray 1 View 11/21/2011  *RADIOLOGY REPORT*  Clinical Data: Shortness of breath.  PORTABLE CHEST - 1 VIEW  Comparison: 11/20/2011  Findings: 0546 hours.  Subsegmental atelectasis again seen in both lower lungs. The cardiopericardial silhouette is enlarged.  No overt pulmonary edema or focal airspace consolidation.  There may be a tiny left pleural effusion. Telemetry leads overlie the chest.  IMPRESSION: Low volumes with  cardiomegaly and bilateral subsegmental atelectasis.  Question tiny left pleural effusion.  Original Report Authenticated By: ERIC A. MANSELL, M.D.   Dg Chest Port 1v Same Day 11/20/2011  *RADIOLOGY REPORT*  Clinical Data: Chest pain, short of breath  PORTABLE CHEST - 1 VIEW SAME DAY  Comparison: Chest radiograph 04/14 1013  Findings: Normal mediastinum and heart silhouette.  There is linear atelectasis in the mid lungs which is increased compared to prior. No pulmonary edema.  No focal consolidation.  No pneumothorax.  IMPRESSION: Increased linear atelectasis in the left and right mid lungs .  No overt  pulmonary edema or consolidation.  Original Report Authenticated By: Genevive Bi, M.D.    Current Medications:   . aspirin EC  81 mg Oral Daily  . carvedilol  3.125 mg Oral BID WC  . enoxaparin (LOVENOX) injection  40 mg Subcutaneous QHS  . fentaNYL      . heparin      . heparin      . lidocaine      . midazolam      .  morphine injection  2 mg Intravenous Once  . nitroGLYCERIN      . potassium chloride  40 mEq Oral Once  . potassium chloride  40 mEq Oral Once  . simvastatin  20 mg Oral q1800  . verapamil        ASSESSMENT AND PLAN: Active Problems:  HTN (hypertension) - Good control on current meds   NSTEMI (non-ST elevated myocardial infarction) - s/p cath with no significant CAD.   Prob crossing Ao Valve - echo done  - see results above, no critical stenosis  Plan - tx telemetry, d/c when med stable.  SignedTheodore Demark , PA-C 9:27 AM 11/21/2011  Patient seen, examined. Available data reviewed. Agree with findings, assessment, and plan as outlined by Theodore Demark, PA-C. EKG's reviewed and T wave changes are impressive. Cardiac enzymes trending down and she is clinically stable. Agree transfer tele today. Will plan chest CT tomorrow am to rule out PE or aortic pathology (doubt).  Tonny Bollman, M.D. 11/21/2011 12:33 PM

## 2011-11-21 NOTE — Progress Notes (Signed)
Received order. Please address if pt should be ambulating. Thx. Will f/u. Ethelda Chick CES, ACSM

## 2011-11-21 NOTE — Progress Notes (Signed)
MEDICATION RELATED - FOLLOW UP   Pharmacy Consult for Lovenox Indication:  DVT prophylaxis  No Known Allergies  Patient Measurements: Height: 5\' 5"  (165.1 cm) Weight: 167 lb 15.9 oz (76.2 kg) IBW/kg (Calculated) : 57    Vital Signs: Temp: 98 F (36.7 C) (04/15 1200) Temp src: Oral (04/15 1200) BP: 129/67 mmHg (04/15 0800) Pulse Rate: 62  (04/15 0800)  Intake/Output from previous day: 04/14 0701 - 04/15 0700 In: 1153.5 [P.O.:440; I.V.:713.5] Out: 850 [Urine:850] Intake/Output from this shift: Total I/O In: 354.5 [P.O.:320; I.V.:34.5] Out: -   Labs:  Basename 11/21/11 0555 11/20/11 1311  WBC 10.3 14.1*  HGB 14.7 14.3  HCT 42.9 40.6  PLT 151 179  CREATININE 0.83 0.64  LABCREA -- --  CREATININE 0.83 0.64   Estimated Creatinine Clearance: 65.3 ml/min (by C-G formula based on Cr of 0.83).   Microbiology: Recent Results (from the past 720 hour(s))  MRSA PCR SCREENING     Status: Normal      Component Value Range Status Comment   MRSA by PCR NEGATIVE  NEGATIVE  Final    Assessment: Lovenox added in this 71 yo with weight of ~ 76 kg and Creat. Of 0.83.  She tolerated initial dose without noted complications.  She will require no further dose adjustments.  Plan:  1.  Continue Lovenox 40 mg daily 2.  Pharmacy will sign off.  Please let us know if we can be of further assistance.  (Thanks)   Nadara Mustard, PharmD., MS Clinical Pharmacist Pager:  [phone] 11/21/2011,12:19 PM

## 2011-11-22 ENCOUNTER — Inpatient Hospital Stay (HOSPITAL_COMMUNITY): Payer: Medicare PPO

## 2011-11-22 ENCOUNTER — Encounter (HOSPITAL_COMMUNITY): Payer: Self-pay | Admitting: Radiology

## 2011-11-22 LAB — BASIC METABOLIC PANEL
Chloride: 104 mEq/L (ref 96–112)
Creatinine, Ser: 0.79 mg/dL (ref 0.50–1.10)
GFR calc Af Amer: >90 mL/min (ref >90)
Potassium: 4 mEq/L (ref 3.5–5.1)
Sodium: 141 mEq/L (ref 135–145)

## 2011-11-22 MED ORDER — NITROGLYCERIN 0.4 MG SL SUBL: 0.4000 mg | SUBLINGUAL_TABLET | SUBLINGUAL | Status: AC | PRN

## 2011-11-22 MED ORDER — IOHEXOL 350 MG/ML SOLN
100.0000 mL | Freq: Once | INTRAVENOUS | Status: AC | PRN
  Administered 2011-11-22: 100 mL via INTRAVENOUS

## 2011-11-22 MED ORDER — NAPROXEN SODIUM 220 MG PO TABS: 220.0000 mg | ORAL_TABLET | Freq: Two times a day (BID) | ORAL | Status: DC

## 2011-11-22 NOTE — Progress Notes (Signed)
Pt HR went up to 190 on the monitor.  When checked on pt, pt was asymptomatic and HR was checked manually.  Manually HR was 84, monitor was inaccurate.  Leads were fixed now pt reading HR 75 on monitor. Will continue to monitor.

## 2011-11-22 NOTE — Progress Notes (Signed)
    Subjective:  No chest pain or dyspnea. No complaints this am.  Objective:  Vital Signs in the last 24 hours: Temp:  [97.4 F (36.3 C)-98.8 F (37.1 C)] 98.1 F (36.7 C) (04/16 0743) Pulse Rate:  [69-72] 69  (04/16 0743) Resp:  [13-18] 18  (04/16 0743) BP: (103-113)/(61-65) 103/64 mmHg (04/16 0743) SpO2:  [93 %-97 %] 96 % (04/16 0743) Weight:  [76.567 kg (168 lb 12.8 oz)-76.9 kg (169 lb 8.5 oz)] 76.567 kg (168 lb 12.8 oz) (04/16 0610)  Intake/Output from previous day: 04/15 0701 - 04/16 0700 In: 1454.5 [P.O.:1420; I.V.:34.5] Out: 475 [Urine:475]  Physical Exam: Pt is alert and oriented, NAD HEENT: normal Neck: JVP - normal, carotids 2+= without bruits Lungs: CTA bilaterally CV: RRR with 2/6 systolic murmur at the LSB Abd: soft, NT, Positive BS, no hepatomegaly Ext: no C/C/E, distal pulses intact and equal Skin: warm/dry no rash   Lab Results:  Basename 11/21/11 0555 11/20/11 1311  WBC 10.3 14.1*  HGB 14.7 14.3  PLT 151 179    Basename 11/22/11 0516 11/21/11 0555  NA 141 138  K 4.0 4.0  CL 104 105  CO2 27 26  GLUCOSE 102* 94  BUN 16 18  CREATININE 0.79 0.83    Basename 11/21/11 1016 11/21/11 0555  TROPONINI 7.55* 8.36*   Tele: sinus rhythm  2D ECHO: Study Conclusions  - Left ventricle: The cavity size was normal. There was mild focal basal hypertrophy of the septum. Systolic function was normal. The estimated ejection fraction was in the range of 55% to 60%. Wall motion was normal; there were no regional wall motion abnormalities. Doppler parameters are consistent with abnormal left ventricular relaxation (grade 1 diastolic dysfunction). - Aortic valve: Functionally bicuspid. There is fusion of right and left coronary cusps with functionally bicuspid aortic valve. There was very mild stenosis. Mild regurgitation. Valve area: 2.4cm^2(VTI). Valve area: 1.69cm^2 (Vmax). - Pericardium, extracardiac: A small pericardial effusion was identified along  the right atrial free wall. There was no evidence of hemodynamic compromise.  Assessment/Plan:  1. NSTEMI without CAD 2. Bicuspid Ao Valve with mild AS 3. Dyslipidemia  Plan chest CT today rule out PE or aortic pathology. If negative will discharge home this afternoon. Would d/c on ASA, statin, and carvedilol. Follow-up in the office in 2 weeks. Still not sure of etiology of her infarct. I think most likely cause is atheroemboli ? Source. If she has a recurrence will need TEE.  Tonny Bollman, M.D. 11/22/2011, 8:20 AM

## 2011-11-22 NOTE — Discharge Summary (Signed)
Agree as above. Please see my progress note this same date.  Tonny Bollman 11/22/2011 11:16 PM

## 2011-11-22 NOTE — Discharge Summary (Signed)
Discharge Summary   Patient ID: Madison Ramos MRN: [id number], DOB/AGE: 13-Mar-1941 71 y.o. Admit date: 11/20/2011 D/C date:     11/22/2011   Primary Discharge Diagnoses:  1. NSTEMI without evidence for significant obstructive CAD by cath 11/20/11 - ?etiology (possibly atheroemboli but uncertain) 2. HTN, accelerated on admission 3. Bicuspid Aortic Valve with mild AS by echo this admission 4. Small pericardial effusion by echo this admission 5. Elevated TSH, for f/u with PCP 6. Mild prominence of the L kidney upper pole collecting system versus parapelvic cyst 7. Hypokalemia, resolved  Hospital Course: 71 y/o F with hx of HTN initially presented to [facility] ER after awakening at 1am with anterior chest pain radiating to her L arm accompanied by SOB. On arrival she was markedly hypertensive with BP 206/99. She was given SL NTG with a drop in her BP. She was comfortable, but after about 20 minutes went into a RBBB with a rate of 96 and later had bradycardia down into the 40s. She refused admission to [facility] and was kept in the ER there to obtain a 2nd troponin which was markedly elevated 11.19. D-dimer was reported as negative. She was started on heparin and loaded with 600mg  of Plavix. She was subsequently transferred to [facility] and at time of arrival EKG showed sinus bradycardia 56bpm with inferior TWI and still had 4/10 CP. She reported a previous cath in 2009 showing a 40% lesion. She was taken urgently to the cath lab demonstrating no significant high grade coronary obstruction to explain findings, no coronary calcification by fluoro. It did show aortic regurgitation, with reduced valve mobility (?biscupid), and enlarged aortic root.  Dr. Riley Kill recommended to hold heparin, D/C Plavix, and proceed with echo demonstrating EF 55-60%, mild focal basal hypertrophy of the septum, functionally bicuspid aortic valve with very mild AS and mild AR. A small pericardial effusion was identified  along the right atrial free wall without evidence of hemodynamic compromise. She was also treated for hypokalemia with PO potassium repletion. She had a CT angio of the chest given her NSTEMI demonstrating no pulmonary embolism, but did show the small pericardial effusion as well as mild prominence of the left kidney upper pole collecting system, versus parapelvic cyst. She was made aware of these findings, along with an elevated TSH that will require follow-up. Today she is feeling well without symptoms. Her EKG still appears abnormal but clinically she is improved. The etiology of her infarct is still unclear, and Dr. Excell Seltzer feels the most likely cause may be atheroemboli and if she has recurrence, she will need TEE. The patient was seen and examined today and felt stable for discharge by Dr. Excell Seltzer.   Discharge Vitals: Blood pressure 112/68, pulse 70, temperature 98 F (36.7 C), temperature source Oral, resp. rate 16, height 5\' 5"  (1.651 m), weight 168 lb 12.8 oz (76.567 kg), SpO2 93.00%.  Labs: Lab Results  Component Value Date   WBC 10.3 11/21/2011   HGB 14.7 11/21/2011   HCT 42.9 11/21/2011   MCV 84.6 11/21/2011   PLT 151 11/21/2011     Lab 11/22/11 0516  NA 141  K 4.0  CL 104  CO2 27  BUN 16  CREATININE 0.79  CALCIUM 8.2*  PROT --  BILITOT --  ALKPHOS --  ALT --  AST --  GLUCOSE 102*    Basename 11/21/11 1016 11/21/11 0555 11/20/11 1939  CKTOTAL 59 67 129  CKMB 7.3* 8.2* 16.3*  TROPONINI 7.55* 8.36* 10.94*  Lab Results  Component Value Date   CHOL 158 11/21/2011   HDL 44 11/21/2011   LDLCALC 61 11/21/2011   TRIG 266* 11/21/2011   Lab Results  Component Value Date   DDIMER 0.57* 11/20/2011    Diagnostic Studies/Procedures   1. Ct Angio Chest W/cm &/or Wo Cm 11/22/2011  *RADIOLOGY REPORT*  Clinical Data: Chest pain.  Hypertension.  CT ANGIOGRAPHY CHEST  Technique:  Multidetector CT imaging of the chest using the standard protocol during bolus administration of intravenous  contrast. Multiplanar reconstructed images including MIPs were obtained and reviewed to evaluate the vascular anatomy.  Contrast: OMNIPAQUE IOHEXOL 350 MG/ML SOLN  Comparison: 11/21/2011  Findings:  No filling defect is identified in the pulmonary arterial tree to suggest pulmonary embolus.  Small pericardial effusion noted.  No pathologic thoracic adenopathy is observed.  No reversal of the normal interventricular septal contour.  There is a suggestion of mild prominence of the left kidney upper pole collecting system, versus parapelvic cyst.  This is only partially included on today's exam.  Scattered subsegmental atelectasis is present in both lungs.  There is spondylosis is present.  On the right side, the inferior process of the posteromedial portion of the third rib fourth rib pseudoarticulates with the adjoining portion of the fifth rib.  IMPRESSION:  1.  Small pericardial effusion. 2.  Scattered subsegmental atelectasis in both lungs. 3.  No embolus is observed. 4.  Mild prominence of the left kidney upper pole collecting system, versus parapelvic cyst.  This is only partially included on today's exam.  Original Report Authenticated By: Dellia Cloud, M.D.   2. Portable Chest X-ray 1 View 11/21/2011  *RADIOLOGY REPORT*  Clinical Data: Shortness of breath.  PORTABLE CHEST - 1 VIEW  Comparison: 11/20/2011  Findings: 0546 hours.  Subsegmental atelectasis again seen in both lower lungs. The cardiopericardial silhouette is enlarged.  No overt pulmonary edema or focal airspace consolidation.  There may be a tiny left pleural effusion. Telemetry leads overlie the chest.  IMPRESSION: Low volumes with cardiomegaly and bilateral subsegmental atelectasis.  Question tiny left pleural effusion.  Original Report Authenticated By: ERIC A. MANSELL, M.D.   3. 2D Echo 11/20/11 Study Conclusions - Left ventricle: The cavity size was normal. There was mild focal basal hypertrophy of the septum. Systolic  function was normal. The estimated ejection fraction was in the range of 55% to 60%. Wall motion was normal; there were no regional wall motion abnormalities. Doppler parameters are consistent with abnormal left ventricular relaxation (grade 1 diastolic dysfunction). - Aortic valve: Functionally bicuspid. There is fusion of right and left coronary cusps with functionally bicuspid aortic valve. There was very mild stenosis. Mild regurgitation. Valve area: 2.4cm^2(VTI). Valve area: 1.69cm^2 (Vmax). - Pericardium, extracardiac: A small pericardial effusion was identified along the right atrial free wall. There was no evidence of hemodynamic compromise.  4. 11/20/11 Cardiac Cath: Coronary angiography:  Coronary dominance: right  Left mainstem: Short, large in caliber, and free of significant disease.  Left anterior descending (LAD): Provides one major septal and diagonal. The diagonal and LAD are both tortuous distally, but without critical narrowing. They are moderately large in caliber. The septal also appears normal.  Left circumflex (LCx): Provides one large caliber OM and an AV circ. No significant obstruction.  Right coronary artery (RCA): Dominant vessel with long PDA, and small PLA system.  Left ventriculography: Not performed. Unable to cross valve easily.  Aortic root: Suggests possibly bicuspid aortic valve. The proximal aorta appears  enlarged. There is probably moderate aortic regurgitation, although mild cannot be excluded.  Final Conclusions:  1. No significant high grade coronary obstruction to explain findings.  2. Aortic regurgitation, with reduced valve mobility (?biscupid), and enlarged aortic root.  3. No coronary calcification by fluoro.     Discharge Medications   Medication List  As of 11/22/2011  3:31 PM   TAKE these medications         aspirin EC 81 MG tablet   Take 81 mg by mouth daily.      carvedilol 3.125 MG tablet   Commonly known as: COREG   Take 3.125 mg by  mouth 2 (two) times daily with a meal.      naproxen sodium 220 MG tablet   Commonly known as: ANAPROX   Take 1 tablet (220 mg total) by mouth 2 (two) times daily with a meal.      nitroGLYCERIN 0.4 MG SL tablet   Commonly known as: NITROSTAT   Place 1 tablet (0.4 mg total) under the tongue every 5 (five) minutes x 3 doses as needed for chest pain.      simvastatin 20 MG tablet   Commonly known as: ZOCOR   Take 20 mg by mouth every evening.            Disposition   The patient will be discharged in stable condition to home. Discharge Orders    Future Appointments: Provider: Department: Dept Phone: Center:   12/06/2011 8:50 AM Beatrice Lecher, PA [facility] [phone] [id number]     Future Orders Please Complete By Expires   Diet - low sodium heart healthy      Increase activity slowly      Comments:   No driving for 1 week. No lifting over 5 lbs for 1 week. No sexual activity for 1 week. Keep procedure site clean & dry. If you notice increased pain, swelling, bleeding or pus, call/return!  You may shower, but no soaking baths/hot tubs/pools for 1 week.     Discharge instructions      Comments:   Only use nitroglycerin for significant chest pain.   Discharge instructions      Comments:   There are a few issues that will need follow-up from your primary care doctor. Please take this discharge information with you.  1) One of your thyroid levels was abnormal (TSH was very mildly elevated) - this is seen occasionally in patients with acute illness, and will need to be rechecked as an outpatient.  2) Your CT scan noted mild prominence of your left upper kidney collecting system which may be an incidental finding since your kidney function is normal, but may represent a cyst. Please follow up with your doctor to determine if any further workup is needed.  3) Your potassium level was slightly low when you came into the hospital but came back to normal with  supplementation. You may need outpatient labwork to make sure this is not a chronic problem for you.      Follow-up Information    Follow up with Primary Care Doctor. (See discharge instructions section for follow-up instructions)       Follow up with Tereso Newcomer, PA. (You will see Tereso Newcomer PA-C at Dr. Earmon Phoenix office on 12/06/11 at 8:50am)    Contact information:   [address] [place] [address] [phone]            Duration of Discharge Encounter: Greater  than 30 minutes including physician and PA time.  Signed, Ronie Spies PA-C 11/22/2011, 3:31 PM

## 2011-11-22 NOTE — Progress Notes (Signed)
Pt and spouse given DC instructions.  Pt and spouse verbalized understanding. Pt DC home via wc.

## 2011-11-22 NOTE — Progress Notes (Signed)
cCARDIAC REHAB PHASE I   PRE:  Rate/Rhythm: 90SRw  BP:  Supine: 100/60  Sitting:   Standing:    SaO2: 92%RA  MODE:  Ambulation: 660 ft   POST:  Rate/Rhythem: 87  BP:  Supine:   Sitting: 98/60  Standing:    SaO2: 98%RA 1415-1450 Pt walked 660 ft with steady gait. Tolerated well. No complaints. Education completed. Permission given to refer to [facility] CRP 2.  Duanne Limerick

## 2011-12-05 ENCOUNTER — Encounter: Payer: Self-pay | Admitting: *Deleted

## 2011-12-06 ENCOUNTER — Encounter: Payer: Self-pay | Admitting: Physician Assistant

## 2011-12-06 ENCOUNTER — Ambulatory Visit (INDEPENDENT_AMBULATORY_CARE_PROVIDER_SITE_OTHER): Payer: Medicare PPO | Admitting: Physician Assistant

## 2011-12-06 ENCOUNTER — Telehealth: Payer: Self-pay | Admitting: *Deleted

## 2011-12-06 VITALS — BP 102/70 | HR 64 | Ht 65.0 in | Wt 172.8 lb

## 2011-12-06 DIAGNOSIS — I214 Non-ST elevation (NSTEMI) myocardial infarction: Secondary | ICD-10-CM

## 2011-12-06 DIAGNOSIS — E785 Hyperlipidemia, unspecified: Secondary | ICD-10-CM

## 2011-12-06 DIAGNOSIS — Q231 Congenital insufficiency of aortic valve: Secondary | ICD-10-CM

## 2011-12-06 DIAGNOSIS — I10 Essential (primary) hypertension: Secondary | ICD-10-CM

## 2011-12-06 DIAGNOSIS — Q2381 Bicuspid aortic valve: Secondary | ICD-10-CM

## 2011-12-06 LAB — BASIC METABOLIC PANEL
CO2: 28 mEq/L (ref 19–32)
Calcium: 8.9 mg/dL (ref 8.4–10.5)
Sodium: 143 mEq/L (ref 135–145)

## 2011-12-06 NOTE — Patient Instructions (Signed)
Your physician recommends that you schedule a follow-up appointment in: 2 MONTHS WITH DR. Excell Seltzer  BMET TODAY 401.1  NO CHANGES TODAY

## 2011-12-06 NOTE — Progress Notes (Signed)
[address] [place], [place]  [address] Phone: [phone] Fax:  [phone]  Date:  12/06/2011   Name:  Madison Ramos       DOB:  10-01-1940 MRN:  [id number]  PCP:  Alinda Deem, MD, MD  Primary Cardiologist:  Dr. Tonny Bollman  Primary Electrophysiologist:  None    History of Present Illness: Madison Ramos is a 71 y.o. female who returns for post hospital follow up.  She was admitted 4/14-4/16 with NSTEMI.  Tx from [facility] with anterior CP and markedly elevated BP (206/99), development of RBBB progressing to bradycardia with HR in the 40s.  She was still having CP upon arrival at [facility] and was taken to the cath lab.  LHC 11/20/11:  No CAD, possible bicuspid aortic valve, prox aorta appears enlarged, ? Mod AI.  No culprit was found for her NSTEMI.  Echo 11/20/11: Ef 55-60%, grade 1 diast dysfxn, functionally bicuspid AV with fusion of right and left coronary cusps, very mild AS, mean 7 mmHg, mild AI, Ao root ok, Asc Ao mildly dilated, small pericardial effusion.  Chest CTA 11/22/11:  Small pericardial effusion, ATX, no embolus, mild prominence of L kidney upper pole collecting system vs parapelvic cyst.  Labs: peak TnI 10.94, Hgb 14.7, K 4, creatinine 0.79, TC 158, HDL 44, LDL 61, TG 2666, DDimer 0.57.  Most likely cause of NSTEMI atheroemboli (? Source).  If recurs needs TEE.  Doing well.  The patient denies chest pain, shortness of breath, syncope, orthopnea, PND or significant pedal edema.  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  Does not think she can participate in cardiac rehab due to work.    Past Medical History  Diagnosis Date  . HTN (hypertension)   . NSTEMI (non-ST elevated myocardial infarction)     Troponin of 11 11/2011 and cath 11/20/11 without evidence for significant obstructive CAD - ?etiology - no PE by CTA, question secondary to atheroemboli  . Bicuspid aortic valve     Mild AS by echo 11/2011  . Pericardial  effusion     Small, 11/2011  . Abnormal CT scan, kidney     Mild prominence of the L kidney upper pole collecting system versus parapelvic cyst on CT 11/2011  . Abnormal TSH     11/2011    Current Outpatient Prescriptions  Medication Sig Dispense Refill  . aspirin EC 81 MG tablet Take 81 mg by mouth daily.      . carvedilol (COREG) 3.125 MG tablet Take 3.125 mg by mouth 2 (two) times daily with a meal.      . naproxen sodium (ANAPROX) 220 MG tablet Take 1 tablet (220 mg total) by mouth 2 (two) times daily with a meal.      . nitroGLYCERIN (NITROSTAT) 0.4 MG SL tablet Place 1 tablet (0.4 mg total) under the tongue every 5 (five) minutes x 3 doses as needed for chest pain.  25 tablet  4  . simvastatin (ZOCOR) 20 MG tablet Take 20 mg by mouth every evening.        Allergies: No Known Allergies  History  Substance Use Topics  . Smoking status: Never Smoker   . Smokeless tobacco: Not on file  . Alcohol Use: No     ROS:  Please see the history of present illness.    All other systems reviewed and negative.   PHYSICAL EXAM: VS:  BP 102/70  Pulse 64  Ht  5\' 5"  (1.651 m)  Wt 172 lb 12.8 oz (78.382 kg)  BMI 28.76 kg/m2 Well nourished, well developed, in no acute distress HEENT: normal Neck: no JVD Cardiac:  normal S1, S2; RRR; no murmur Lungs:  clear to auscultation bilaterally, no wheezing, rhonchi or rales Abd: soft, nontender, no hepatomegaly Ext: no edema; right wrist stable with notable ecchymoses  Skin: warm and dry Neuro:  CNs 2-12 intact, no focal abnormalities noted  EKG:  NSR, HR 64, LAD, TW inversions leads 2, 3, aVF, V4-6, prolonged QT (QTc 493 ms); no change from prior   ASSESSMENT AND PLAN:  1. NSTEMI (non-ST elevated myocardial infarction)  Doing well.  Continue ASA and statin.  No culprit found during admission.  If recurs, will need TEE.  She cannot participate in cardiac rehab.  She will continue to increase activity slowly.  I filled out her FMLA paperwork  today.  She can return to work but I advised her to not lift anything over 20 lbs for at least 3 mos.  Follow up with Dr. Tonny Bollman in 2 mos.   2. HTN (hypertension)  Controlled.  Continue current therapy.  Check BMET today (K+ low in the hospital).   3. Hyperlipidemia  Managed by PCP.    4. Functionally bicuspid aortic valve  Will likely need follow up echo in 12 mos.   5.  Elevated TSH      Noted in hospital.  She will follow up with PCP for repeat.  6.  ? Left Kidney Cyst      She will follow up with PCP for this.     Luna Glasgow, PA-C  8:41 AM 12/06/2011

## 2011-12-06 NOTE — Telephone Encounter (Signed)
Message copied by Tarri Fuller on Tue Dec 06, 2011  5:22 PM ------      Message from: [place], [place] T      Created: Tue Dec 06, 2011  5:00 PM       Please notify patient that the lab results are ok.      Tereso Newcomer, PA-C  5:00 PM 12/06/2011

## 2011-12-06 NOTE — Telephone Encounter (Signed)
lmom labs ok 

## 2011-12-12 ENCOUNTER — Telehealth: Payer: Self-pay | Admitting: Cardiovascular Disease

## 2011-12-12 NOTE — Telephone Encounter (Signed)
[phone] or [phone] ok to leave message ,rtn to work note to rtn on sat  5-11, pls call when ready

## 2011-12-12 NOTE — Telephone Encounter (Signed)
Letter completed and placed at the front desk for pick-up.  I left a message on the pt's phone that letter is ready for pick-up.

## 2011-12-29 ENCOUNTER — Telehealth: Payer: Self-pay | Admitting: Cardiovascular Disease

## 2011-12-29 NOTE — Telephone Encounter (Signed)
On her return to work papers you put a weight lift limit of 20 lb and her work place will not let her return. Please call pt and inform her with update of limitations. Pt aware that she will not hear back from Dr/ nurse till next week Tuesday.

## 2011-12-29 NOTE — Telephone Encounter (Signed)
Please return call to patient at [phone] regarding return to work restrictions.

## 2012-01-03 NOTE — Telephone Encounter (Signed)
This is fine with me. She can return to work. Will do a note for her in the morning.

## 2012-01-03 NOTE — Telephone Encounter (Signed)
Letter complete.

## 2012-01-03 NOTE — Telephone Encounter (Signed)
Per 12/06/11 office note form Tereso Newcomer PA-C this was a specific instruction for pt. I left a message for the pt to call back. I need to know what type of work the pt performs and how much weight she lifts at her job.

## 2012-01-03 NOTE — Telephone Encounter (Signed)
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  If the pt has to lift a heavier cake then she asks for help.  Because the pt's job description states that she must be able to lift, she has not been able to return to work because they did not have a job for her to perform. If the pt can get a note from Dr Excell Seltzer stating she can return to work on 01/04/12 with no restrictions then she can go back to working in [employer]. The pt states that she does not do any heavy lifting and always ask for help. The pt is going to come into the office tomorrow and pick-up note if approved by Dr Excell Seltzer.

## 2012-02-15 ENCOUNTER — Encounter: Payer: Self-pay | Admitting: Cardiovascular Disease

## 2012-02-15 ENCOUNTER — Ambulatory Visit (INDEPENDENT_AMBULATORY_CARE_PROVIDER_SITE_OTHER): Payer: Medicare PPO | Admitting: Cardiovascular Disease

## 2012-02-15 VITALS — BP 131/85 | HR 70 | Ht 65.0 in | Wt 173.8 lb

## 2012-02-15 DIAGNOSIS — I2510 Atherosclerotic heart disease of native coronary artery without angina pectoris: Secondary | ICD-10-CM

## 2012-02-15 MED ORDER — AMOXICILLIN 500 MG PO TABS: ORAL_TABLET | ORAL | Status: DC

## 2012-02-15 NOTE — Patient Instructions (Signed)
Your physician wants you to follow-up in: 1 YEAR with Dr Excell Seltzer.  You will receive a reminder letter in the mail two months in advance. If you don't receive a letter, please call our office to schedule the follow-up appointment.  Your physician has requested that you have an echocardiogram in 1 YEAR. Echocardiography is a painless test that uses sound waves to create images of your heart. It provides your doctor with information about the size and shape of your heart and how well your heart's chambers and valves are working. This procedure takes approximately one hour. There are no restrictions for this procedure.  Your physician recommends that you continue on your current medications as directed. Please refer to the Current Medication list given to you today.  Dr Excell Seltzer recommends that you use SBE prophylaxis.

## 2012-02-15 NOTE — Progress Notes (Signed)
HPI:  This is a 71 year old woman presenting for followup evaluation. She presented this spring with a non-ST elevation MI. She had significant cardiac enzyme rise and typical symptoms of MI. She underwent urgent cardiac catheterization and demonstrated no obstructive coronary artery disease. She was diagnosed with a bicuspid aortic valve and is noted to have mild aortic stenosis. The etiology of her myocardial infarction was not clear, but an embolic event was considered especially in light of her valvular heart disease. She presents today for followup evaluation.  Overall the patient is doing well. 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 She's tired at the end of the day after being up on her feet. She's had no recent chest pain or pressure. She does have some exertional dyspnea but thinks this is because she is "out of shape." She denies edema, lightheadedness, palpitations, orthopnea, or PND. She's been compliant with her medications. She feels convinced that her myocardial infarction occurred because she was having so much difficulty tolerating oral steroids for treatment of a respiratory infection.  Outpatient Encounter Prescriptions as of 02/15/2012  Medication Sig Dispense Refill  . aspirin EC 81 MG tablet Take 81 mg by mouth daily.      . carvedilol (COREG) 3.125 MG tablet Take 3.125 mg by mouth 2 (two) times daily with a meal.      . naproxen sodium (ANAPROX) 220 MG tablet Take 220 mg by mouth as needed.      . nitroGLYCERIN (NITROSTAT) 0.4 MG SL tablet Place 1 tablet (0.4 mg total) under the tongue every 5 (five) minutes x 3 doses as needed for chest pain.  25 tablet  4  . simvastatin (ZOCOR) 20 MG tablet Take 20 mg by mouth every evening.        No Known Allergies  Past Medical History  Diagnosis Date  . HTN (hypertension)   . NSTEMI (non-ST elevated myocardial infarction)     Troponin of 11 11/2011 and cath 11/20/11 without evidence for significant obstructive CAD - ?etiology  - no PE by CTA, question secondary to atheroemboli  . Bicuspid aortic valve     Mild AS by echo 11/2011  . Pericardial effusion     Small, 11/2011  . Abnormal CT scan, kidney     Mild prominence of the L kidney upper pole collecting system versus parapelvic cyst on CT 11/2011  . Abnormal TSH     11/2011    ROS: Negative except as per HPI  BP 131/85  Pulse 70  Ht 5\' 5"  (1.651 m)  Wt 78.835 kg (173 lb 12.8 oz)  BMI 28.92 kg/m2  PHYSICAL EXAM: Pt is alert and oriented, pleasant woman in NAD HEENT: normal Neck: JVP - normal, carotids 2+= without bruits Lungs: CTA bilaterally CV: RRR without murmur or gallop Abd: soft, NT, Positive BS, no hepatomegaly Ext: no C/C/E, distal pulses intact and equal Skin: warm/dry no rash  Echo (11/20/2011): Study Conclusions  - Left ventricle: The cavity size was normal. There was mild focal basal hypertrophy of the septum. Systolic function was normal. The estimated ejection fraction was in the range of 55% to 60%. Wall motion was normal; there were no regional wall motion abnormalities. Doppler parameters are consistent with abnormal left ventricular relaxation (grade 1 diastolic dysfunction). - Aortic valve: Functionally bicuspid. There is fusion of right and left coronary cusps with functionally bicuspid aortic valve. There was very mild stenosis. Mild regurgitation. Valve area: 2.4cm^2(VTI). Valve area: 1.69cm^2 (Vmax). - Pericardium, extracardiac: A  small pericardial effusion was identified along the right atrial free wall. There was no evidence of hemodynamic compromise.  ASSESSMENT AND PLAN: 1. Non-STEMI, uncertain etiology. No recurrence of chest pain. No coronary artery disease at catheterization. Continue observation.  2. Hyperlipidemia. The patient is followed by her primary care provider. She is on a low-dose statin drug with simvastatin 20 mg.  3. Bicuspid aortic valve stenosis. This was very mild by echo. Will repeat an echo  study in one year and then will likely spreadout surveillance if this remains stable. She was advised to begin SBE prophylaxis.  Madison Ramos 02/15/2012 2:19 PM

## 2012-11-24 IMAGING — CR DG CHEST 2V
2 series · 2 of 2 positions shown · non-contrast
Comparison: None.

CLINICAL DATA: Knee osteoarthritis. Pre-op respiratory exam.

CHEST - 2 VIEW

[view not recorded (1 of 2)]
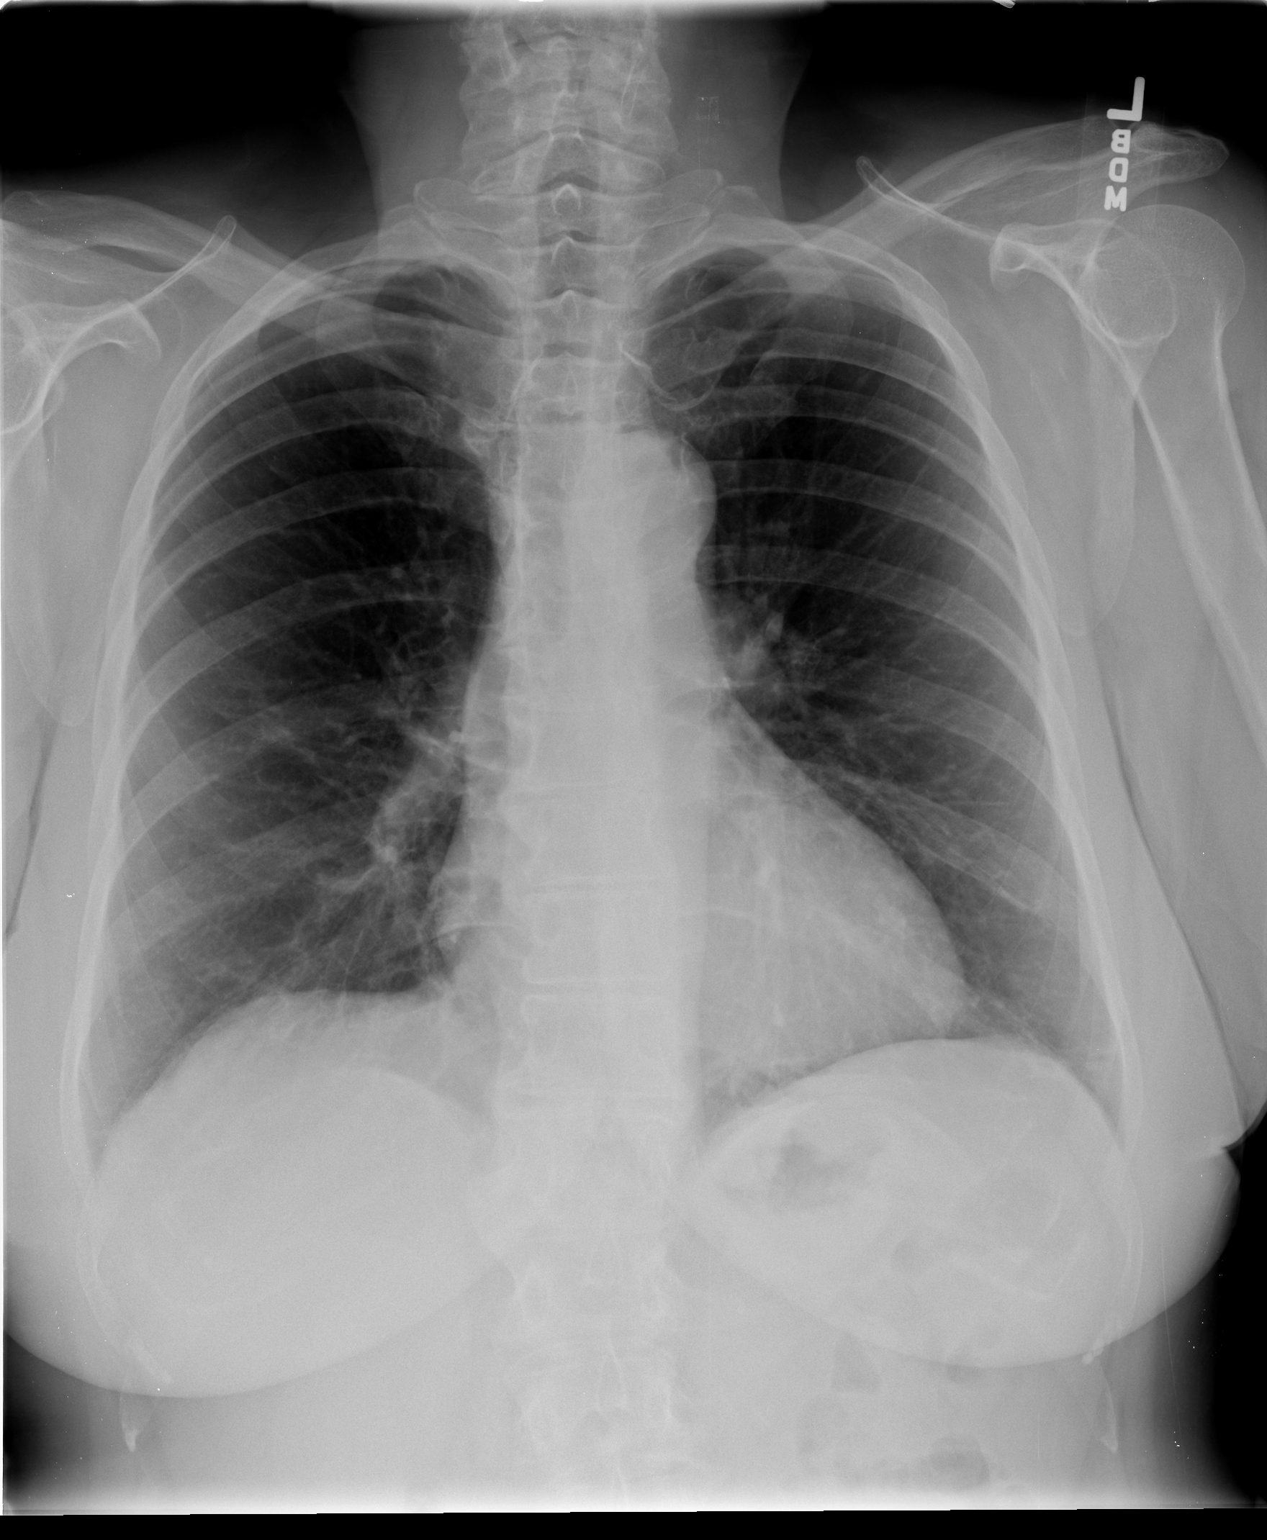

[view not recorded (2 of 2)]
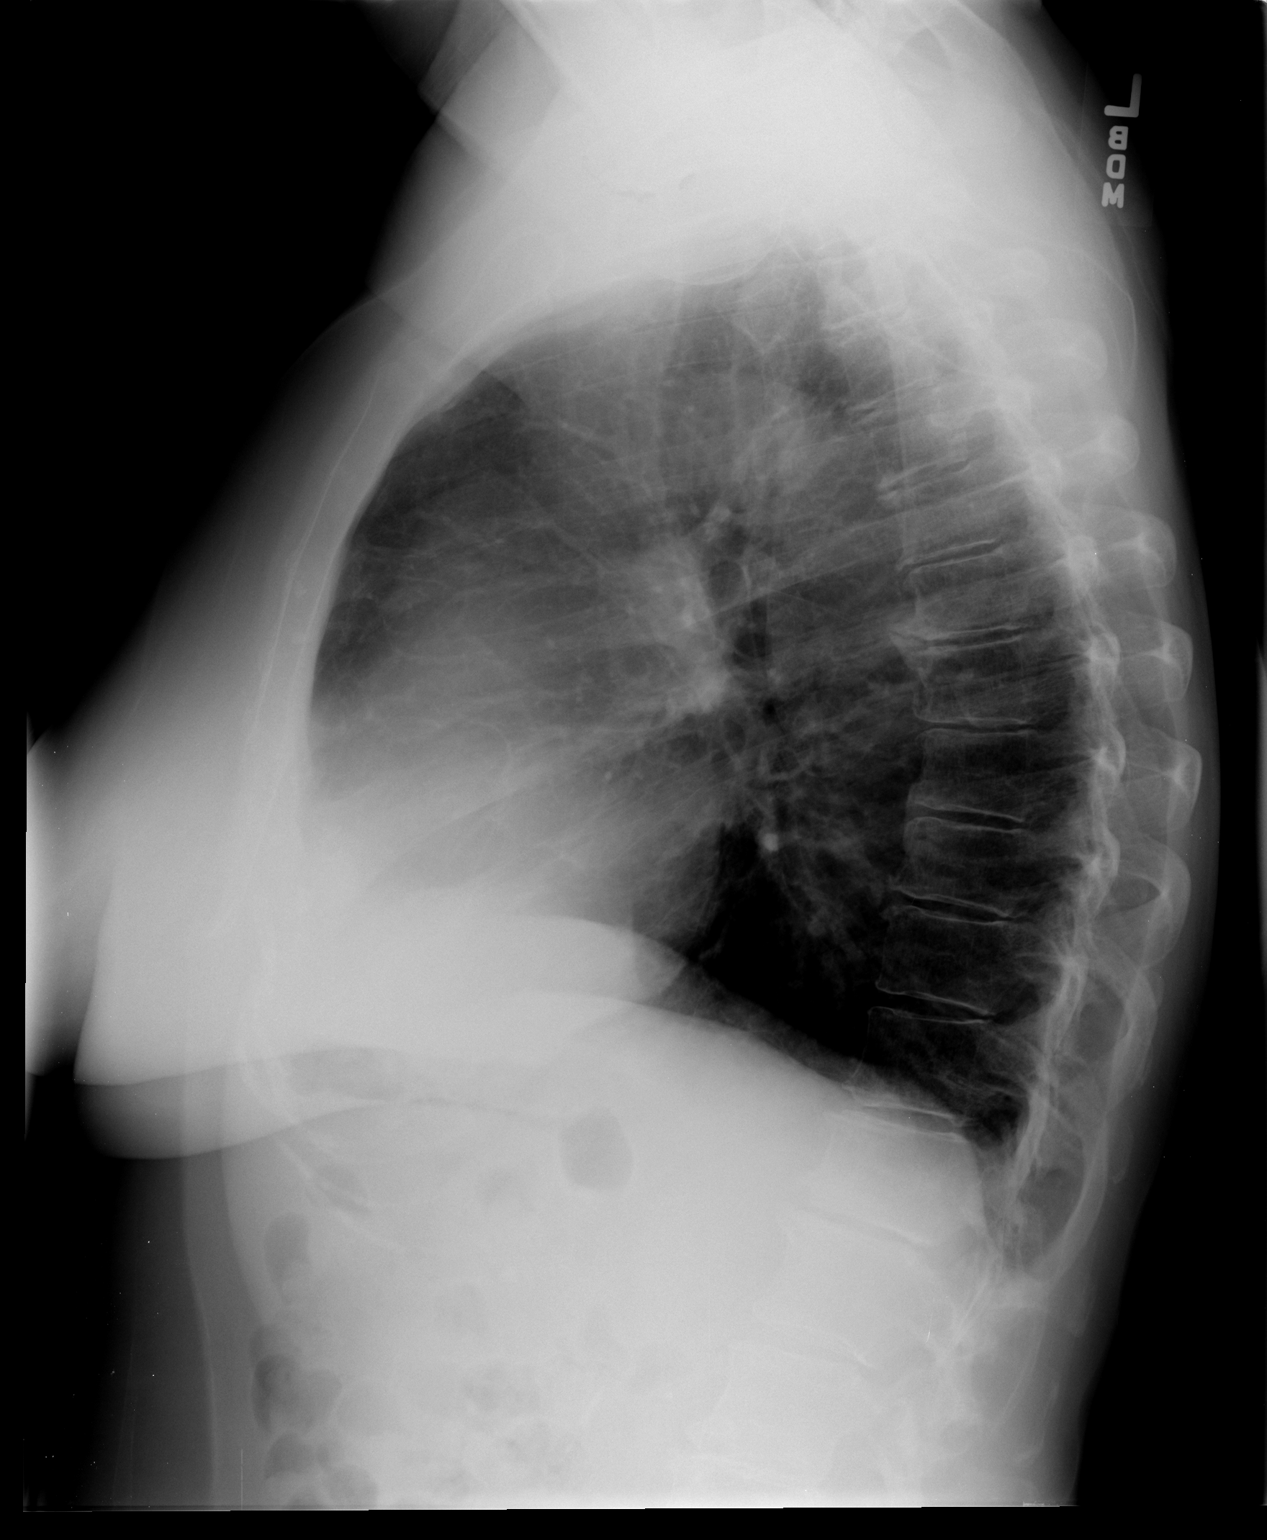

[2 of 2 positions shown; findings below may reference images not displayed]

FINDINGS: Heart size is normal.  Mild scarring seen in the right
lower lung.  No evidence of  pulmonary inferior edema.  No evidence
of pleural effusion.  No mass or adenopathy identified.
IMPRESSION: Mild right lower lung scarring.  No active disease.

## 2013-02-18 ENCOUNTER — Ambulatory Visit (HOSPITAL_COMMUNITY): Payer: Medicare PPO | Attending: Family Medicine | Admitting: Radiology

## 2013-02-18 ENCOUNTER — Other Ambulatory Visit (HOSPITAL_COMMUNITY): Payer: Self-pay | Admitting: Cardiovascular Disease

## 2013-02-18 DIAGNOSIS — I359 Nonrheumatic aortic valve disorder, unspecified: Secondary | ICD-10-CM

## 2013-02-18 DIAGNOSIS — I10 Essential (primary) hypertension: Secondary | ICD-10-CM | POA: Insufficient documentation

## 2013-02-18 DIAGNOSIS — E785 Hyperlipidemia, unspecified: Secondary | ICD-10-CM | POA: Insufficient documentation

## 2013-02-18 NOTE — Progress Notes (Signed)
Echocardiogram performed.  

## 2013-02-22 ENCOUNTER — Encounter: Payer: Self-pay | Admitting: Cardiovascular Disease

## 2013-02-22 ENCOUNTER — Ambulatory Visit (INDEPENDENT_AMBULATORY_CARE_PROVIDER_SITE_OTHER): Payer: Medicare PPO | Admitting: Cardiovascular Disease

## 2013-02-22 VITALS — BP 126/78 | HR 59 | Ht 65.0 in | Wt 170.0 lb

## 2013-02-22 DIAGNOSIS — I359 Nonrheumatic aortic valve disorder, unspecified: Secondary | ICD-10-CM

## 2013-02-22 DIAGNOSIS — I2510 Atherosclerotic heart disease of native coronary artery without angina pectoris: Secondary | ICD-10-CM

## 2013-02-22 DIAGNOSIS — E785 Hyperlipidemia, unspecified: Secondary | ICD-10-CM

## 2013-02-22 NOTE — Patient Instructions (Addendum)
Your physician wants you to follow-up in:  12 months.  You will receive a reminder letter in the mail two months in advance. If you don't receive a letter, please call our office to schedule the follow-up appointment.   

## 2013-02-22 NOTE — Progress Notes (Signed)
HPI:  72 year old woman presenting for followup evaluation. The patient presented in 2013 with a non-ST elevation MI. Cardiac catheterization demonstrated no evidence of obstructive coronary artery disease. She was diagnosed with a bicuspid aortic valve and has mild aortic stenosis. She presents today for followup evaluation.  She is feeling well. She retired last fall. She denies chest pain, chest pressure, dyspnea, edema, palpitations, lightheadedness, or syncope. She's been compliant with medications. She just had lipids checked a few weeks ago and got a good report.  Outpatient Encounter Prescriptions as of 02/22/2013  Medication Sig Dispense Refill  . amoxicillin (AMOXIL) 500 MG tablet Take 4 tablets by mouth one hour prior to procedure  8 tablet  2  . aspirin EC 81 MG tablet Take 81 mg by mouth daily.      . carvedilol (COREG) 3.125 MG tablet Take 3.125 mg by mouth 2 (two) times daily with a meal.      . naproxen sodium (ANAPROX) 220 MG tablet Take 220 mg by mouth as needed.      . nitroGLYCERIN (NITROSTAT) 0.4 MG SL tablet Place 1 tablet (0.4 mg total) under the tongue every 5 (five) minutes x 3 doses as needed for chest pain.  25 tablet  4  . simvastatin (ZOCOR) 20 MG tablet Take 20 mg by mouth every evening.       No facility-administered encounter medications on file as of 02/22/2013.    No Known Allergies  Past Medical History  Diagnosis Date  . HTN (hypertension)   . NSTEMI (non-ST elevated myocardial infarction)     Troponin of 11 11/2011 and cath 11/20/11 without evidence for significant obstructive CAD - ?etiology - no PE by CTA, question secondary to atheroemboli  . Bicuspid aortic valve     Mild AS by echo 11/2011  . Pericardial effusion     Small, 11/2011  . Abnormal CT scan, kidney     Mild prominence of the L kidney upper pole collecting system versus parapelvic cyst on CT 11/2011  . Abnormal TSH     11/2011    ROS: Negative except as per HPI  BP 126/78  Pulse 59   Ht 5\' 5"  (1.651 m)  Wt 77.111 kg (170 lb)  BMI 28.29 kg/m2  PHYSICAL EXAM: Pt is alert and oriented, NAD HEENT: normal Neck: JVP - normal, carotids 2+= without bruits Lungs: CTA bilaterally CV: RRR with an ejection click but no murmur appreciated Abd: soft, NT, Positive BS, no hepatomegaly Ext: no C/C/E, distal pulses intact and equal Skin: warm/dry no rash  EKG:  Sinus rhythm 59 beats per minute, within normal limits.  2D Echo: Study Conclusions  - Left ventricle: Distal septal and posterior basal hypokinesis The cavity size was mildly dilated. Wall thickness was increased in a pattern of mild LVH. There was mild focal basal hypertrophy of the septum. Systolic function was normal. The estimated ejection fraction was in the range of 50% to 55%. - Aortic valve: Probable bicuspid AV with raphe at 2:00 There was mild stenosis. Mild regurgitation. Mean gradient: 10mm Hg (S). Peak gradient: 17mm Hg (S). - Atrial septum: No defect or patent foramen ovale was identified. - Pericardium, extracardiac: A trivial pericardial effusion was identified posterior to the heart.  ASSESSMENT AND PLAN: 1. Mild aortic stenosis. The patient is asymptomatic. Her echocardiogram was reviewed. Will see her back in one year for clinical followup. She has no evidence of aortic root dilatation on echo. Hopefully this will remain indolent and she  will not require surgical treatment.  2. History of non-STEMI without obstructive coronary disease. Her echo does show regional wall motion abnormalities but preserved overall ejection fraction. She should remain on antiplatelet therapy with aspirin. Would also recommend long-term statin treatment and she is doing.  3. Hyperlipidemia. Lipids followed by her primary care physician. Patient is on simvastatin 20 mg.  Tonny Bollman 02/22/2013 11:22 AM

## 2014-02-27 IMAGING — CR DG CHEST 1V PORT
1 series · 1 of 1 positions shown · non-contrast
Comparison: 11/20/2011

CLINICAL DATA: Shortness of breath.

PORTABLE CHEST - 1 VIEW

[AP]
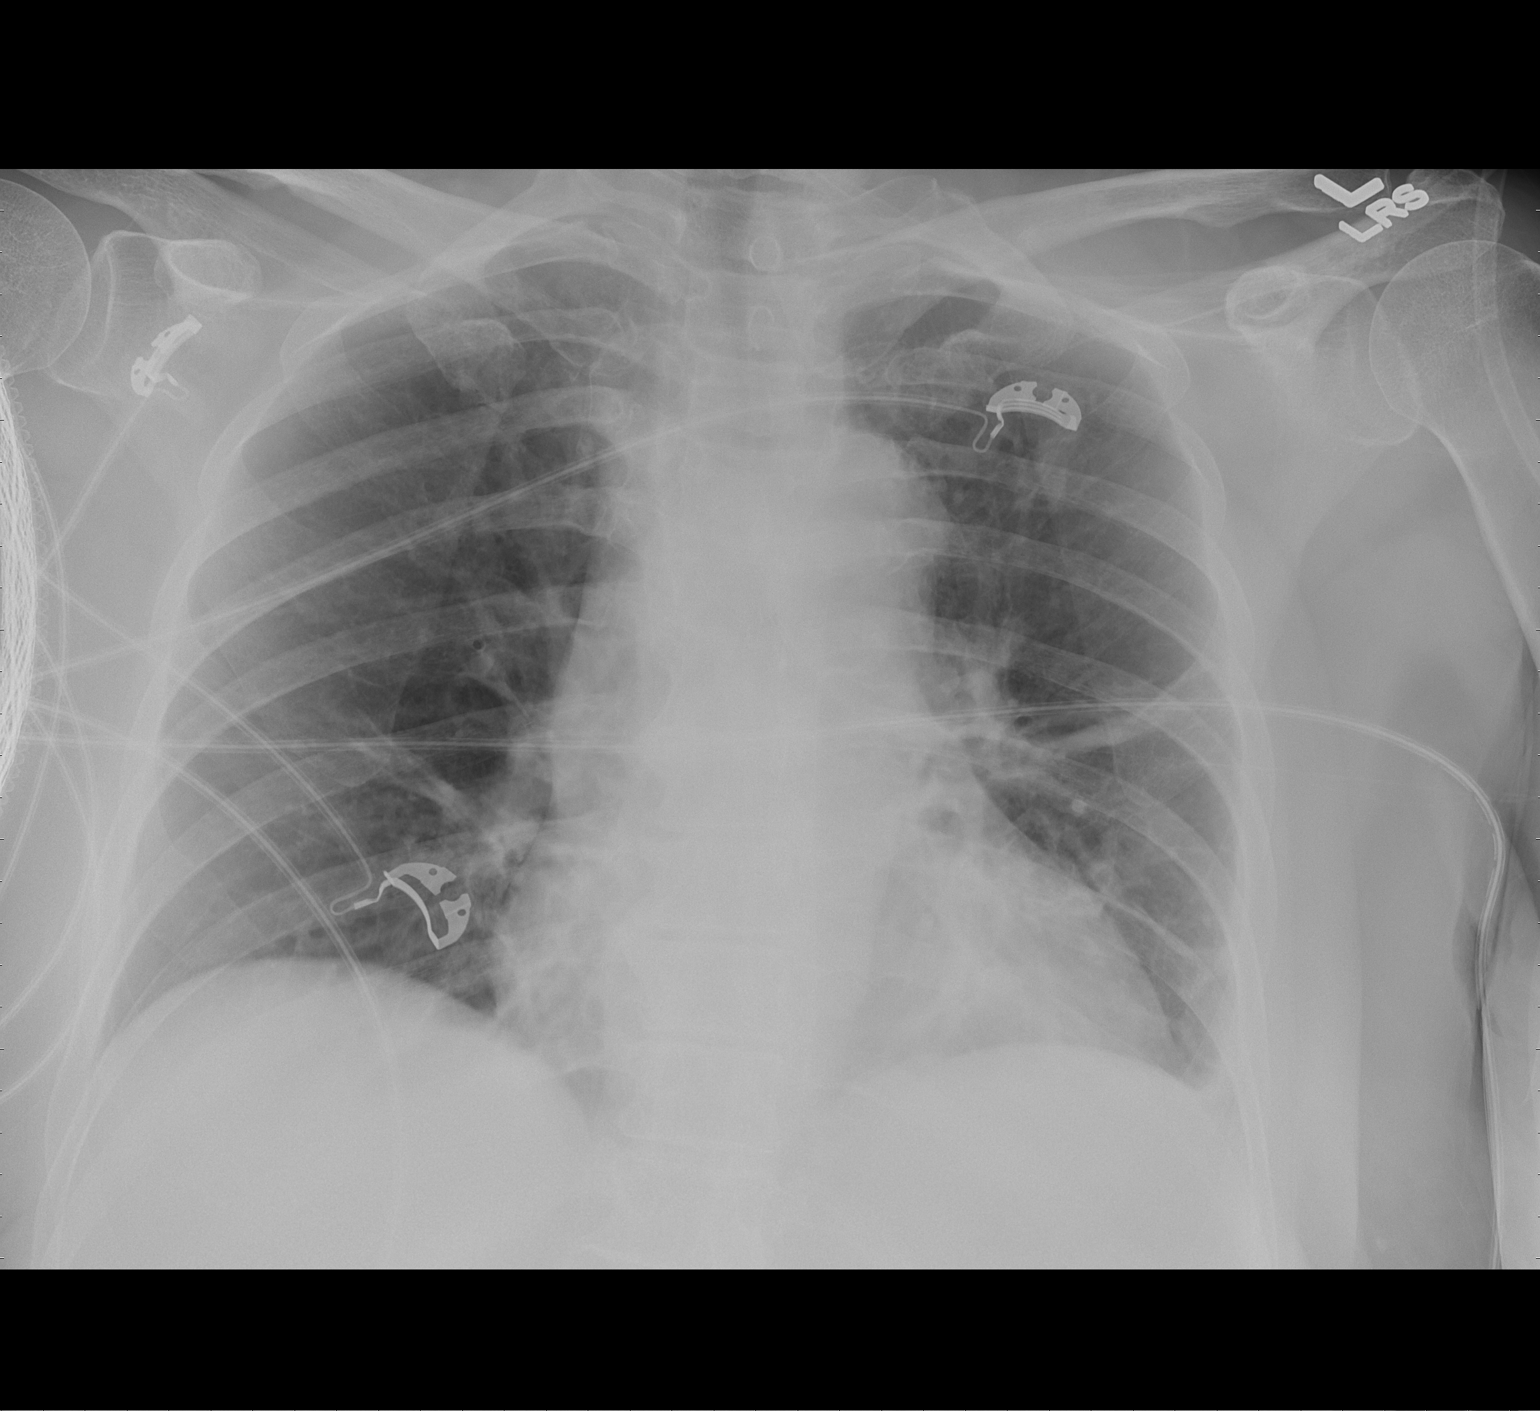

[1 of 1 positions shown; findings below may reference images not displayed]

FINDINGS: 7989 hours.  Subsegmental atelectasis again seen in both
lower lungs. The cardiopericardial silhouette is enlarged.  No
overt pulmonary edema or focal airspace consolidation.  There may
be a tiny left pleural effusion. Telemetry leads overlie the chest.
IMPRESSION: Low volumes with cardiomegaly and bilateral subsegmental
atelectasis.

Question tiny left pleural effusion.

## 2014-03-04 ENCOUNTER — Ambulatory Visit (INDEPENDENT_AMBULATORY_CARE_PROVIDER_SITE_OTHER): Payer: Medicare PPO | Admitting: Cardiovascular Disease

## 2014-03-04 ENCOUNTER — Encounter: Payer: Self-pay | Admitting: Cardiovascular Disease

## 2014-03-04 VITALS — BP 130/84 | HR 66 | Ht 65.0 in | Wt 177.0 lb

## 2014-03-04 DIAGNOSIS — E785 Hyperlipidemia, unspecified: Secondary | ICD-10-CM

## 2014-03-04 DIAGNOSIS — I10 Essential (primary) hypertension: Secondary | ICD-10-CM

## 2014-03-04 DIAGNOSIS — I214 Non-ST elevation (NSTEMI) myocardial infarction: Secondary | ICD-10-CM

## 2014-03-04 NOTE — Progress Notes (Signed)
HPI:  73 year old woman presenting for cardiac followup evaluation. The patient has a history of non-ST elevation MI in 2013. At that time, she underwent cardiac catheterization demonstrating no evidence of obstructive CAD. She was diagnosed with a bicuspid aortic valve.  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 She had markedly elevated blood pressure, underwent evaluation by her PCP. Ultimately quit the job and BP has been fine since then.   She denies chest pain, chest pressure, or shortness of breath. Has been walking for exercise without exertional problems.   Outpatient Encounter Prescriptions as of 03/04/2014  Medication Sig  . amoxicillin (AMOXIL) 500 MG tablet Take 4 tablets by mouth one hour prior to procedure dental  . aspirin EC 81 MG tablet Take 81 mg by mouth daily.  . carvedilol (COREG) 3.125 MG tablet Take 3.125 mg by mouth 2 (two) times daily with a meal.  . naproxen sodium (ANAPROX) 220 MG tablet Take 220 mg by mouth as needed.  . nitroGLYCERIN (NITROSTAT) 0.4 MG SL tablet Place 1 tablet (0.4 mg total) under the tongue every 5 (five) minutes x 3 doses as needed for chest pain.  . simvastatin (ZOCOR) 20 MG tablet Take 20 mg by mouth every evening.  . [DISCONTINUED] amoxicillin (AMOXIL) 500 MG tablet Take 4 tablets by mouth one hour prior to procedure    No Known Allergies  Past Medical History  Diagnosis Date  . HTN (hypertension)   . NSTEMI (non-ST elevated myocardial infarction)     Troponin of 11 11/2011 and cath 11/20/11 without evidence for significant obstructive CAD - ?etiology - no PE by CTA, question secondary to atheroemboli  . Bicuspid aortic valve     Mild AS by echo 11/2011  . Pericardial effusion     Small, 11/2011  . Abnormal CT scan, kidney     Mild prominence of the L kidney upper pole collecting system versus parapelvic cyst on CT 11/2011  . Abnormal TSH     11/2011    ROS: Positive for knee pain, otherwise negative except as per  HPI  BP 130/84  Pulse 66  Ht 5\' 5"  (1.651 m)  Wt 177 lb (80.287 kg)  BMI 29.45 kg/m2  PHYSICAL EXAM: Pt is alert and oriented, NAD HEENT: normal Neck: JVP - normal, carotids 2+= without bruits Lungs: CTA bilaterally CV: RRR with an ejection click and grade 1/6 systolic murmur best heard at the LLSB Abd: soft, NT, Positive BS, no hepatomegaly Ext: no C/C/E, distal pulses intact and equal Skin: warm/dry no rash  EKG:  Normal sinus rhythm 66 beats per minute, leftward axis, within normal limits otherwise.  2-D echocardiogram 02-18-2013: Study Conclusions  - Left ventricle: Distal septal and posterior basal hypokinesis The cavity size was mildly dilated. Wall thickness was increased in a pattern of mild LVH. There was mild focal basal hypertrophy of the septum. Systolic function was normal. The estimated ejection fraction was in the range of 50% to 55%. - Aortic valve: Probable bicuspid AV with raphe at 2:00 There was mild stenosis. Mild regurgitation. Mean gradient: 10mm Hg (S). Peak gradient: 17mm Hg (S). - Atrial septum: No defect or patent foramen ovale was identified. - Pericardium, extracardiac: A trivial pericardial effusion was identified posterior to the heart.  ASSESSMENT AND PLAN: 1. CAD, nonobstuctive with hx NSTEMI. Clinically stable. Continue aspirin, carvedilol, and simvastatin  2. Hypertension. Blood pressure appears well-controlled on low-dose carvedilol. Blood pressure was driven by stress and by the  patient's report, but now seems to be under good control  3. Hyperlipidemia. Patient takes low dose simvastatin. She is followed by her primary care physician.  I will see her back in one year for followup evaluation.  Tonny BollmanMichael Oronde Hallenbeck MD 03/04/2014 11:33 AM

## 2014-03-04 NOTE — Patient Instructions (Signed)
Your physician wants you to follow-up in: 1 YEAR with Dr Cooper.  You will receive a reminder letter in the mail two months in advance. If you don't receive a letter, please call our office to schedule the follow-up appointment.  Your physician recommends that you continue on your current medications as directed. Please refer to the Current Medication list given to you today.  

## 2014-07-17 ENCOUNTER — Encounter (HOSPITAL_COMMUNITY): Payer: Self-pay | Admitting: Cardiology

## 2014-09-01 DIAGNOSIS — H2511 Age-related nuclear cataract, right eye: Secondary | ICD-10-CM | POA: Diagnosis not present

## 2014-09-17 DIAGNOSIS — H2511 Age-related nuclear cataract, right eye: Secondary | ICD-10-CM | POA: Diagnosis not present

## 2014-11-26 DIAGNOSIS — H2512 Age-related nuclear cataract, left eye: Secondary | ICD-10-CM | POA: Diagnosis not present

## 2015-02-03 DIAGNOSIS — Z961 Presence of intraocular lens: Secondary | ICD-10-CM | POA: Diagnosis not present

## 2015-04-06 DIAGNOSIS — M8589 Other specified disorders of bone density and structure, multiple sites: Secondary | ICD-10-CM | POA: Diagnosis not present

## 2015-04-06 DIAGNOSIS — Z79899 Other long term (current) drug therapy: Secondary | ICD-10-CM | POA: Diagnosis not present

## 2015-04-06 DIAGNOSIS — Z6831 Body mass index (BMI) 31.0-31.9, adult: Secondary | ICD-10-CM | POA: Diagnosis not present

## 2015-04-06 DIAGNOSIS — E780 Pure hypercholesterolemia: Secondary | ICD-10-CM | POA: Diagnosis not present

## 2015-04-06 DIAGNOSIS — E669 Obesity, unspecified: Secondary | ICD-10-CM | POA: Diagnosis not present

## 2015-04-06 DIAGNOSIS — Z0000 Encounter for general adult medical examination without abnormal findings: Secondary | ICD-10-CM | POA: Diagnosis not present

## 2015-04-06 DIAGNOSIS — I10 Essential (primary) hypertension: Secondary | ICD-10-CM | POA: Diagnosis not present

## 2015-04-09 DIAGNOSIS — E669 Obesity, unspecified: Secondary | ICD-10-CM | POA: Diagnosis not present

## 2015-04-09 DIAGNOSIS — M8589 Other specified disorders of bone density and structure, multiple sites: Secondary | ICD-10-CM | POA: Diagnosis not present

## 2015-04-09 DIAGNOSIS — E780 Pure hypercholesterolemia: Secondary | ICD-10-CM | POA: Diagnosis not present

## 2015-04-09 DIAGNOSIS — E559 Vitamin D deficiency, unspecified: Secondary | ICD-10-CM | POA: Diagnosis not present

## 2015-04-09 DIAGNOSIS — I10 Essential (primary) hypertension: Secondary | ICD-10-CM | POA: Diagnosis not present

## 2015-04-09 DIAGNOSIS — Z0000 Encounter for general adult medical examination without abnormal findings: Secondary | ICD-10-CM | POA: Diagnosis not present

## 2015-04-09 DIAGNOSIS — Z79899 Other long term (current) drug therapy: Secondary | ICD-10-CM | POA: Diagnosis not present

## 2015-04-09 DIAGNOSIS — Z6831 Body mass index (BMI) 31.0-31.9, adult: Secondary | ICD-10-CM | POA: Diagnosis not present

## 2015-05-18 DIAGNOSIS — N63 Unspecified lump in breast: Secondary | ICD-10-CM | POA: Diagnosis not present

## 2015-05-18 DIAGNOSIS — N959 Unspecified menopausal and perimenopausal disorder: Secondary | ICD-10-CM | POA: Diagnosis not present

## 2015-05-18 DIAGNOSIS — M8589 Other specified disorders of bone density and structure, multiple sites: Secondary | ICD-10-CM | POA: Diagnosis not present

## 2015-05-18 DIAGNOSIS — M8580 Other specified disorders of bone density and structure, unspecified site: Secondary | ICD-10-CM | POA: Diagnosis not present

## 2015-05-18 DIAGNOSIS — R921 Mammographic calcification found on diagnostic imaging of breast: Secondary | ICD-10-CM | POA: Diagnosis not present

## 2015-07-07 DIAGNOSIS — H26493 Other secondary cataract, bilateral: Secondary | ICD-10-CM | POA: Diagnosis not present

## 2015-09-09 DIAGNOSIS — M25512 Pain in left shoulder: Secondary | ICD-10-CM | POA: Diagnosis not present

## 2015-10-05 DIAGNOSIS — M25512 Pain in left shoulder: Secondary | ICD-10-CM | POA: Diagnosis not present

## 2015-10-19 DIAGNOSIS — E7801 Familial hypercholesterolemia: Secondary | ICD-10-CM | POA: Diagnosis not present

## 2015-10-19 DIAGNOSIS — E7800 Pure hypercholesterolemia, unspecified: Secondary | ICD-10-CM | POA: Diagnosis not present

## 2015-10-19 DIAGNOSIS — I10 Essential (primary) hypertension: Secondary | ICD-10-CM | POA: Diagnosis not present

## 2015-10-19 DIAGNOSIS — Z79899 Other long term (current) drug therapy: Secondary | ICD-10-CM | POA: Diagnosis not present

## 2015-11-04 DIAGNOSIS — I10 Essential (primary) hypertension: Secondary | ICD-10-CM | POA: Diagnosis not present

## 2015-11-04 DIAGNOSIS — E7800 Pure hypercholesterolemia, unspecified: Secondary | ICD-10-CM | POA: Diagnosis not present

## 2016-05-19 DIAGNOSIS — Z1239 Encounter for other screening for malignant neoplasm of breast: Secondary | ICD-10-CM | POA: Diagnosis not present

## 2016-05-19 DIAGNOSIS — E669 Obesity, unspecified: Secondary | ICD-10-CM | POA: Diagnosis not present

## 2016-05-19 DIAGNOSIS — Z6830 Body mass index (BMI) 30.0-30.9, adult: Secondary | ICD-10-CM | POA: Diagnosis not present

## 2016-05-19 DIAGNOSIS — I10 Essential (primary) hypertension: Secondary | ICD-10-CM | POA: Diagnosis not present

## 2016-05-19 DIAGNOSIS — Z0000 Encounter for general adult medical examination without abnormal findings: Secondary | ICD-10-CM | POA: Diagnosis not present

## 2016-05-19 DIAGNOSIS — E7800 Pure hypercholesterolemia, unspecified: Secondary | ICD-10-CM | POA: Diagnosis not present

## 2016-05-19 DIAGNOSIS — M8589 Other specified disorders of bone density and structure, multiple sites: Secondary | ICD-10-CM | POA: Diagnosis not present

## 2016-06-20 DIAGNOSIS — I10 Essential (primary) hypertension: Secondary | ICD-10-CM | POA: Diagnosis not present

## 2016-07-07 DIAGNOSIS — H26493 Other secondary cataract, bilateral: Secondary | ICD-10-CM | POA: Diagnosis not present

## 2016-10-18 DIAGNOSIS — Z1231 Encounter for screening mammogram for malignant neoplasm of breast: Secondary | ICD-10-CM | POA: Diagnosis not present

## 2016-12-19 DIAGNOSIS — E7800 Pure hypercholesterolemia, unspecified: Secondary | ICD-10-CM | POA: Diagnosis not present

## 2016-12-19 DIAGNOSIS — I10 Essential (primary) hypertension: Secondary | ICD-10-CM | POA: Diagnosis not present

## 2016-12-19 DIAGNOSIS — Z79899 Other long term (current) drug therapy: Secondary | ICD-10-CM | POA: Diagnosis not present

## 2016-12-29 DIAGNOSIS — I10 Essential (primary) hypertension: Secondary | ICD-10-CM | POA: Diagnosis not present

## 2016-12-29 DIAGNOSIS — M1711 Unilateral primary osteoarthritis, right knee: Secondary | ICD-10-CM | POA: Diagnosis not present

## 2017-01-26 DIAGNOSIS — E785 Hyperlipidemia, unspecified: Secondary | ICD-10-CM | POA: Diagnosis not present

## 2017-01-26 DIAGNOSIS — I10 Essential (primary) hypertension: Secondary | ICD-10-CM | POA: Diagnosis not present

## 2017-01-26 DIAGNOSIS — Z6830 Body mass index (BMI) 30.0-30.9, adult: Secondary | ICD-10-CM | POA: Diagnosis not present

## 2017-01-26 DIAGNOSIS — E669 Obesity, unspecified: Secondary | ICD-10-CM | POA: Diagnosis not present

## 2017-01-27 DIAGNOSIS — M1711 Unilateral primary osteoarthritis, right knee: Secondary | ICD-10-CM | POA: Diagnosis not present

## 2017-02-06 DIAGNOSIS — Z1211 Encounter for screening for malignant neoplasm of colon: Secondary | ICD-10-CM | POA: Diagnosis not present

## 2017-02-06 DIAGNOSIS — D122 Benign neoplasm of ascending colon: Secondary | ICD-10-CM | POA: Diagnosis not present

## 2017-02-06 DIAGNOSIS — K635 Polyp of colon: Secondary | ICD-10-CM | POA: Diagnosis not present

## 2017-02-06 DIAGNOSIS — K5730 Diverticulosis of large intestine without perforation or abscess without bleeding: Secondary | ICD-10-CM | POA: Diagnosis not present

## 2017-02-06 DIAGNOSIS — Z86010 Personal history of colonic polyps: Secondary | ICD-10-CM | POA: Diagnosis not present

## 2017-03-08 DIAGNOSIS — Z01810 Encounter for preprocedural cardiovascular examination: Secondary | ICD-10-CM | POA: Diagnosis not present

## 2017-03-08 DIAGNOSIS — I10 Essential (primary) hypertension: Secondary | ICD-10-CM | POA: Diagnosis not present

## 2017-03-08 DIAGNOSIS — M1711 Unilateral primary osteoarthritis, right knee: Secondary | ICD-10-CM | POA: Diagnosis not present

## 2017-03-08 DIAGNOSIS — E7800 Pure hypercholesterolemia, unspecified: Secondary | ICD-10-CM | POA: Diagnosis not present

## 2017-03-20 ENCOUNTER — Ambulatory Visit: Payer: Self-pay | Admitting: Orthopedic Surgery

## 2017-03-20 NOTE — Progress Notes (Signed)
Please place orders in EPIC as patient is being scheduled for a pre-op appointment! Thank you! 

## 2017-04-05 NOTE — Progress Notes (Addendum)
03-08-17  Surgical clearance on chart from PCP Dr. Belva CromePenner  03-08-17  EKG SB w/1 degree AV block

## 2017-04-05 NOTE — Patient Instructions (Addendum)
Madison Ramos  04/05/2017   Your procedure is scheduled on: 04-17-17  [facility]  Entrance Report to Admitting at 9:05 AM   Call this number if you have problems the morning of surgery [phone]   Remember: ONLY 1 PERSON MAY GO WITH YOU TO SHORT STAY TO GET  READY MORNING OF YOUR SURGERY.  Do not eat food or drink liquids :After Midnight.     Take these medicines the morning of surgery with A SIP OF WATER: Carvedilol (Coreg) and Rosuvastatin (Crestor)                                You may not have any metal on your body including hair pins and              piercings  Do not wear jewelry, make-up, lotions, powders or perfumes, deodorant             Do not wear nail polish.  Do not shave  48 hours prior to surgery.               Do not bring valuables to the hospital. [facility] IS NOT             RESPONSIBLE   FOR VALUABLES.  Contacts, dentures or bridgework may not be worn into surgery.  Leave suitcase in the car. After surgery it may be brought to your room.                 Please read over the following fact sheets you were given: _____________________________________________________________________             [facility] - Preparing for Surgery Before surgery, you can play an important role.  Because skin is not sterile, your skin needs to be as free of germs as possible.  You can reduce the number of germs on your skin by washing with CHG (chlorahexidine gluconate) soap before surgery.  CHG is an antiseptic cleaner which kills germs and bonds with the skin to continue killing germs even after washing. Please DO NOT use if you have an allergy to CHG or antibacterial soaps.  If your skin becomes reddened/irritated stop using the CHG and inform your nurse when you arrive at Short Stay. Do not shave (including legs and underarms) for at least 48 hours prior to the first CHG shower.  You may shave your face/neck. Please follow these  instructions carefully:  1.  Shower with CHG Soap the night before surgery and the  morning of Surgery.  2.  If you choose to wash your hair, wash your hair first as usual with your  normal  shampoo.  3.  After you shampoo, rinse your hair and body thoroughly to remove the  shampoo.                           4.  Use CHG as you would any other liquid soap.  You can apply chg directly  to the skin and wash                       Gently with a scrungie or clean washcloth.  5.  Apply the CHG Soap to your body ONLY FROM THE NECK DOWN.   Do not  use on face/ open                           Wound or open sores. Avoid contact with eyes, ears mouth and genitals (private parts).                       Wash face,  Genitals (private parts) with your normal soap.             6.  Wash thoroughly, paying special attention to the area where your surgery  will be performed.  7.  Thoroughly rinse your body with warm water from the neck down.  8.  DO NOT shower/wash with your normal soap after using and rinsing off  the CHG Soap.                9.  Pat yourself dry with a clean towel.            10.  Wear clean pajamas.            11.  Place clean sheets on your bed the night of your first shower and do not  sleep with pets. Day of Surgery : Do not apply any lotions/deodorants the morning of surgery.  Please wear clean clothes to the hospital/surgery center.  FAILURE TO FOLLOW THESE INSTRUCTIONS MAY RESULT IN THE CANCELLATION OF YOUR SURGERY PATIENT SIGNATURE_________________________________  NURSE SIGNATURE__________________________________  ________________________________________________________________________   Adam Phenix  An incentive spirometer is a tool that can help keep your lungs clear and active. This tool measures how well you are filling your lungs with each breath. Taking long deep breaths may help reverse or decrease the chance of developing breathing (pulmonary) problems (especially  infection) following:  A long period of time when you are unable to move or be active. BEFORE THE PROCEDURE   If the spirometer includes an indicator to show your best effort, your nurse or respiratory therapist will set it to a desired goal.  If possible, sit up straight or lean slightly forward. Try not to slouch.  Hold the incentive spirometer in an upright position. INSTRUCTIONS FOR USE  1. Sit on the edge of your bed if possible, or sit up as far as you can in bed or on a chair. 2. Hold the incentive spirometer in an upright position. 3. Breathe out normally. 4. Place the mouthpiece in your mouth and seal your lips tightly around it. 5. Breathe in slowly and as deeply as possible, raising the piston or the ball toward the top of the column. 6. Hold your breath for 3-5 seconds or for as long as possible. Allow the piston or ball to fall to the bottom of the column. 7. Remove the mouthpiece from your mouth and breathe out normally. 8. Rest for a few seconds and repeat Steps 1 through 7 at least 10 times every 1-2 hours when you are awake. Take your time and take a few normal breaths between deep breaths. 9. The spirometer may include an indicator to show your best effort. Use the indicator as a goal to work toward during each repetition. 10. After each set of 10 deep breaths, practice coughing to be sure your lungs are clear. If you have an incision (the cut made at the time of surgery), support your incision when coughing by placing a pillow or rolled up towels firmly against it. Once you are able to get out of bed, walk  around indoors and cough well. You may stop using the incentive spirometer when instructed by your caregiver.  RISKS AND COMPLICATIONS  Take your time so you do not get dizzy or light-headed.  If you are in pain, you may need to take or ask for pain medication before doing incentive spirometry. It is harder to take a deep breath if you are having pain. AFTER  USE  Rest and breathe slowly and easily.  It can be helpful to keep track of a log of your progress. Your caregiver can provide you with a simple table to help with this. If you are using the spirometer at home, follow these instructions: [place] IF:   You are having difficultly using the spirometer.  You have trouble using the spirometer as often as instructed.  Your pain medication is not giving enough relief while using the spirometer.  You develop fever of 100.5 F (38.1 C) or higher. SEEK IMMEDIATE MEDICAL CARE IF:   You cough up bloody sputum that had not been present before.  You develop fever of 102 F (38.9 C) or greater.  You develop worsening pain at or near the incision site. MAKE SURE YOU:   Understand these instructions.  Will watch your condition.  Will get help right away if you are not doing well or get worse. Document Released: 12/05/2006 Document Revised: 10/17/2011 Document Reviewed: 02/05/2007 ExitCare Patient Information 2014 ExitCare, [place].   ________________________________________________________________________  WHAT IS A BLOOD TRANSFUSION? Blood Transfusion Information  A transfusion is the replacement of blood or some of its parts. Blood is made up of multiple cells which provide different functions.  Red blood cells carry oxygen and are used for blood loss replacement.  White blood cells fight against infection.  Platelets control bleeding.  Plasma helps clot blood.  Other blood products are available for specialized needs, such as hemophilia or other clotting disorders. BEFORE THE TRANSFUSION  Who gives blood for transfusions?   Healthy volunteers who are fully evaluated to make sure their blood is safe. This is blood bank blood. Transfusion therapy is the safest it has ever been in the practice of medicine. Before blood is taken from a donor, a complete history is taken to make sure that person has no history of diseases  nor engages in risky social behavior (examples are intravenous drug use or sexual activity with multiple partners). The donor's travel history is screened to minimize risk of transmitting infections, such as malaria. The donated blood is tested for signs of infectious diseases, such as HIV and hepatitis. The blood is then tested to be sure it is compatible with you in order to minimize the chance of a transfusion reaction. If you or a relative donates blood, this is often done in anticipation of surgery and is not appropriate for emergency situations. It takes many days to process the donated blood. RISKS AND COMPLICATIONS Although transfusion therapy is very safe and saves many lives, the main dangers of transfusion include:   Getting an infectious disease.  Developing a transfusion reaction. This is an allergic reaction to something in the blood you were given. Every precaution is taken to prevent this. The decision to have a blood transfusion has been considered carefully by your caregiver before blood is given. Blood is not given unless the benefits outweigh the risks. AFTER THE TRANSFUSION  Right after receiving a blood transfusion, you will usually feel much better and more energetic. This is especially true if your red blood cells have  gotten low (anemic). The transfusion raises the level of the red blood cells which carry oxygen, and this usually causes an energy increase.  The nurse administering the transfusion will monitor you carefully for complications. HOME CARE INSTRUCTIONS  No special instructions are needed after a transfusion. You may find your energy is better. Speak with your caregiver about any limitations on activity for underlying diseases you may have. SEEK MEDICAL CARE IF:   Your condition is not improving after your transfusion.  You develop redness or irritation at the intravenous (IV) site. SEEK IMMEDIATE MEDICAL CARE IF:  Any of the following symptoms occur over the  next 12 hours:  Shaking chills.  You have a temperature by mouth above 102 F (38.9 C), not controlled by medicine.  Chest, back, or muscle pain.  People around you feel you are not acting correctly or are confused.  Shortness of breath or difficulty breathing.  Dizziness and fainting.  You get a rash or develop hives.  You have a decrease in urine output.  Your urine turns a dark color or changes to pink, red, or brown. Any of the following symptoms occur over the next 10 days:  You have a temperature by mouth above 102 F (38.9 C), not controlled by medicine.  Shortness of breath.  Weakness after normal activity.  The white part of the eye turns yellow (jaundice).  You have a decrease in the amount of urine or are urinating less often.  Your urine turns a dark color or changes to pink, red, or brown. Document Released: 07/22/2000 Document Revised: 10/17/2011 Document Reviewed: 03/10/2008 [facility] Patient Information 2014 [place], [place].  _______________________________________________________________________

## 2017-04-07 ENCOUNTER — Encounter (HOSPITAL_COMMUNITY)
Admission: RE | Admit: 2017-04-07 | Discharge: 2017-04-07 | Disposition: A | Discharge status: Home / Self Care | Payer: Medicare PPO | Source: Ambulatory Visit | Attending: Orthopedic Surgery | Admitting: Orthopedic Surgery

## 2017-04-07 ENCOUNTER — Encounter (HOSPITAL_COMMUNITY): Payer: Self-pay

## 2017-04-07 DIAGNOSIS — M1711 Unilateral primary osteoarthritis, right knee: Secondary | ICD-10-CM | POA: Insufficient documentation

## 2017-04-07 DIAGNOSIS — Z01812 Encounter for preprocedural laboratory examination: Secondary | ICD-10-CM | POA: Diagnosis not present

## 2017-04-07 LAB — COMPREHENSIVE METABOLIC PANEL
ALK PHOS: 64 U/L (ref 38–126)
ALT: 9 U/L — ABNORMAL LOW (ref 14–54)
ANION GAP: 5 (ref 5–15)
AST: 20 U/L (ref 15–41)
Albumin: 4.2 g/dL (ref 3.5–5.0)
BILIRUBIN TOTAL: 0.6 mg/dL (ref 0.3–1.2)
BUN: 11 mg/dL (ref 6–20)
CALCIUM: 9.2 mg/dL (ref 8.9–10.3)
CO2: 28 mmol/L (ref 22–32)
Chloride: 109 mmol/L (ref 101–111)
Creatinine, Ser: 0.93 mg/dL (ref 0.44–1.00)
GFR calc non Af Amer: 59 mL/min — ABNORMAL LOW (ref >60)
Glucose, Bld: 118 mg/dL — ABNORMAL HIGH (ref 65–99)
POTASSIUM: 5.2 mmol/L — AB (ref 3.5–5.1)
Sodium: 142 mmol/L (ref 135–145)
TOTAL PROTEIN: 7.1 g/dL (ref 6.5–8.1)

## 2017-04-07 LAB — CBC
HEMATOCRIT: 42.4 % (ref 36.0–46.0)
HEMOGLOBIN: 14.3 g/dL (ref 12.0–15.0)
MCH: 29.9 pg (ref 26.0–34.0)
MCHC: 33.7 g/dL (ref 30.0–36.0)
MCV: 88.5 fL (ref 78.0–100.0)
Platelets: 187 10*3/uL (ref 150–400)
RBC: 4.79 MIL/uL (ref 3.87–5.11)
RDW: 13.8 % (ref 11.5–15.5)
WBC: 8.1 10*3/uL (ref 4.0–10.5)

## 2017-04-07 LAB — PROTIME-INR
INR: 1.15
PROTHROMBIN TIME: 14.6 s (ref 11.4–15.2)

## 2017-04-07 LAB — SURGICAL PCR SCREEN
MRSA, PCR: NEGATIVE
Staphylococcus aureus: NEGATIVE

## 2017-04-07 LAB — APTT: APTT: 28 s (ref 24–36)

## 2017-04-07 LAB — ABO/RH: ABO/RH(D): O NEG

## 2017-04-10 ENCOUNTER — Ambulatory Visit: Payer: Self-pay | Admitting: Orthopedic Surgery

## 2017-04-10 NOTE — H&P (Signed)
Madison LlanosLois R Ramos DOB: 10-16-1940 Married / Language: [place] / Race: White Female Date of admission: April 17, 2017 Chief complaint: right knee pain History of Present Illness  The patient is a 76 year old female who comes in for a preoperative History and Physical. The patient is scheduled for a right total knee arthroplasty to be performed by Dr. Gus RankinFrank V. Aluisio, MD at [facility] on 04/17/2017. The patient is a 76 year old female who presented with knee complaints. The patient reports right knee symptoms including: pain (wakes pt. up at night), instability, grinding (popping) and limping which began 1 year(s) ago without any known injury. The patient describes their pain as sharp.The patient feels that the symptoms are worsening. The patient has the current diagnosis of knee osteoarthritis. This problem has not been previously evaluated. Past treatment for this problem has included intra-articular injection of corticosteroids (Dr. [name] cortisone injection about 1 year: didn't help). Symptoms are reported to be located in the right knee and right medial knee and include knee pain and medial knee pain. Symptoms are exacerbated by motion at the knee and weight bearing. Current treatment includes knee brace and nonsteroidal anti-inflammatory drugs (Tylenol). She has had a total knee on the left by Dr. [name] in the past and that has worked well, but her right knee is giving her difficulties now. It hurts in the medial aspect, crunches and grinds going up and down steps and she has had cortisone and it did not help. She is now ready to get her right knee replaced as her husband has had knee replacement by Dr. [name]. They have been treated conservatively in the past for the above stated problem and despite conservative measures, they continue to have progressive pain and severe functional limitations and dysfunction. They have failed non-operative management including home exercise,  medications, and injections. It is felt that they would benefit from undergoing total joint replacement. Risks and benefits of the procedure have been discussed with the patient and they elect to proceed with surgery. There are no active contraindications to surgery such as ongoing infection or rapidly progressive neurological disease.    Problem List/Past Medical Primary osteoarthritis of right knee (M17.11)  Heart murmur  High blood pressure  Temporal Arteritis  Varicose veins    Allergies (Alexzandrew L. Perkins, III PA-C; 03/22/2017 2:07 PM) Corticosteroids Support *DIETARY PRODUCTS/DIETARY MANAGEMENT PRODUCTS*  elevates blood pressure  Family History  Depression  Mother. Drug / Alcohol Addiction  Father. First Degree Relatives  reported Heart Disease  Father. Heart disease in female family member before age 75  Father  Deceased. age 75 Mother  Deceased. age 75  Social History  Children  3 Current work status  retired Scientist, physiologicalxercise  Exercises weekly; does running / walking and gym / [employer] Living situation  live with spouse Marital status  married Never consumed alcohol  01/27/2017: Never consumed alcohol No history of drug/alcohol rehab  Not under pain contract  Number of flights of stairs before winded  greater than 5 Tobacco / smoke exposure  01/27/2017: no Tobacco use  Never smoker. 01/27/2017 Advance Directives  Living Will. Post-Surgical Plans  Home With Family, Home, Straight to Outpatient Therapy.  Medication History Rosuvastatin Calcium (10MG  Tablet, Oral) Active. Carvedilol (3.125MG  Tablet, Oral) Active. Tylenol (325MG  Tablet, 1 (one) Oral) Active. Ramipril (5MG  Capsule, Oral) Active. Fish Oil Active. Multivitamin Active.   Past Surgical History  Carpal Tunnel Repair  right Cataract Surgery  bilateral Colon Polyp Removal - Open  Total  Knee Replacement  left Tubal Ligation    Review of Systems  General Not Present-  Chills, Fatigue, Fever, Memory Loss, Night Sweats, Weight Gain and Weight Loss. Skin Not Present- Eczema, Hives, Itching, Lesions and Rash. HEENT Not Present- Dentures, Double Vision, Headache, Hearing Loss, Tinnitus and Visual Loss. Respiratory Not Present- Allergies, Chronic Cough, Coughing up blood, Shortness of breath at rest and Shortness of breath with exertion. Cardiovascular Not Present- Chest Pain, Difficulty Breathing Lying Down, Murmur, Palpitations, Racing/skipping heartbeats and Swelling. Gastrointestinal Not Present- Abdominal Pain, Bloody Stool, Constipation, Diarrhea, Difficulty Swallowing, Heartburn, Jaundice, Loss of appetitie, Nausea and Vomiting. Female Genitourinary Not Present- Blood in Urine, Discharge, Flank Pain, Incontinence, Painful Urination, Urgency, Urinary frequency, Urinary Retention, Urinating at Night and Weak urinary stream. Musculoskeletal Present- Joint Pain. Not Present- Back Pain, Joint Swelling, Morning Stiffness, Muscle Pain, Muscle Weakness and Spasms. Neurological Not Present- Blackout spells, Difficulty with balance, Dizziness, Paralysis, Tremor and Weakness. Psychiatric Not Present- Insomnia.  Vitals  Weight: 160 lb Height: 65in Weight was reported by patient. Height was reported by patient. Body Surface Area: 1.8 m Body Mass Index: 26.63 kg/m  Pulse: 64 (Regular)  BP: 138/84 (Sitting, Left Arm, Standard)   Physical Exam  General Mental Status -Alert, cooperative and good historian. General Appearance-pleasant, Not in acute distress. Orientation-Oriented X3. Build & Nutrition-Well nourished and Well developed.  Head and Neck Head-normocephalic, atraumatic . Neck Global Assessment - supple, no bruit auscultated on the right, no bruit auscultated on the left.  Eye Vision-Wears corrective lenses. Pupil - Bilateral-Regular and Round. Motion - Bilateral-EOMI.  Chest and Lung Exam Auscultation Breath sounds -  clear at anterior chest wall and clear at posterior chest wall. Adventitious sounds - No Adventitious sounds.  Cardiovascular Auscultation Rhythm - Regular rate and rhythm. Heart Sounds - S1 WNL and S2 WNL. Murmurs & Other Heart Sounds: Murmur 1 - Location - Pulmonic Area. Timing - Early systolic. Grade - II/VI.  Abdomen Palpation/Percussion Tenderness - Abdomen is non-tender to palpation. Rigidity (guarding) - Abdomen is soft. Auscultation Auscultation of the abdomen reveals - Bowel sounds normal.  Female Genitourinary Note: Not done, not pertinent to present illness   Musculoskeletal Note: right knee shows a varus deformity, tenderness in the medial joint line, crepitation at the patellofemoral joint throughout range of motion. No ligamentous instability.  IMAGING X-rays show bone on bone medial compartment, moderately severe patellofemoral change, and moderate lateral compartment changes.     Assessment & Plan  Primary osteoarthritis of right knee (M17.11)  Note:Surgical Plans: Right Total Knee Replacement  Disposition: Home with family, Straight to outpatient  PCP: Dr. Penner - Patient has been seen preoperatively and felt to be stable for surgery.  IV TXA  Anesthesia Issues: None  Patient was instructed on what medications to stop prior to surgery.  Signed electronically by Alexzandrew L Perkins, III PA-C  

## 2017-04-17 ENCOUNTER — Encounter (HOSPITAL_COMMUNITY)
Admission: RE | Disposition: A | Discharge status: Home / Self Care | Payer: Self-pay | Source: Ambulatory Visit | Attending: Orthopedic Surgery

## 2017-04-17 ENCOUNTER — Encounter (HOSPITAL_COMMUNITY): Payer: Self-pay | Admitting: *Deleted

## 2017-04-17 ENCOUNTER — Inpatient Hospital Stay (HOSPITAL_COMMUNITY): Payer: Medicare PPO | Admitting: Anesthesiology

## 2017-04-17 ENCOUNTER — Inpatient Hospital Stay (HOSPITAL_COMMUNITY)
Admission: RE | Admit: 2017-04-17 | Discharge: 2017-04-19 | DRG: 470 | Disposition: A | Discharge status: Home / Self Care | Payer: Medicare PPO | Source: Ambulatory Visit | Attending: Orthopedic Surgery | Admitting: Orthopedic Surgery

## 2017-04-17 DIAGNOSIS — M25361 Other instability, right knee: Secondary | ICD-10-CM | POA: Diagnosis not present

## 2017-04-17 DIAGNOSIS — M316 Other giant cell arteritis: Secondary | ICD-10-CM | POA: Diagnosis not present

## 2017-04-17 DIAGNOSIS — G8918 Other acute postprocedural pain: Secondary | ICD-10-CM | POA: Diagnosis not present

## 2017-04-17 DIAGNOSIS — E785 Hyperlipidemia, unspecified: Secondary | ICD-10-CM | POA: Diagnosis not present

## 2017-04-17 DIAGNOSIS — I252 Old myocardial infarction: Secondary | ICD-10-CM

## 2017-04-17 DIAGNOSIS — M179 Osteoarthritis of knee, unspecified: Secondary | ICD-10-CM

## 2017-04-17 DIAGNOSIS — I8390 Asymptomatic varicose veins of unspecified lower extremity: Secondary | ICD-10-CM | POA: Diagnosis not present

## 2017-04-17 DIAGNOSIS — Z96652 Presence of left artificial knee joint: Secondary | ICD-10-CM | POA: Diagnosis present

## 2017-04-17 DIAGNOSIS — M1711 Unilateral primary osteoarthritis, right knee: Secondary | ICD-10-CM | POA: Diagnosis not present

## 2017-04-17 DIAGNOSIS — Z79899 Other long term (current) drug therapy: Secondary | ICD-10-CM

## 2017-04-17 DIAGNOSIS — I10 Essential (primary) hypertension: Secondary | ICD-10-CM | POA: Diagnosis not present

## 2017-04-17 DIAGNOSIS — Q231 Congenital insufficiency of aortic valve: Secondary | ICD-10-CM

## 2017-04-17 DIAGNOSIS — M1710 Unilateral primary osteoarthritis, unspecified knee: Secondary | ICD-10-CM | POA: Diagnosis present

## 2017-04-17 HISTORY — PX: TOTAL KNEE ARTHROPLASTY: SHX125

## 2017-04-17 LAB — TYPE AND SCREEN
ABO/RH(D): O NEG
ANTIBODY SCREEN: NEGATIVE

## 2017-04-17 SURGERY — ARTHROPLASTY, KNEE, TOTAL
Anesthesia: General | Site: Knee | Laterality: Right

## 2017-04-17 MED ORDER — METOCLOPRAMIDE HCL 5 MG/ML IJ SOLN: 5.0000 mg | Freq: Three times a day (TID) | INTRAMUSCULAR | Status: DC | PRN

## 2017-04-17 MED ORDER — DEXAMETHASONE SODIUM PHOSPHATE 10 MG/ML IJ SOLN
INTRAMUSCULAR | Status: AC
  Filled 2017-04-17: qty 1

## 2017-04-17 MED ORDER — CARVEDILOL 3.125 MG PO TABS
3.1250 mg | ORAL_TABLET | Freq: Two times a day (BID) | ORAL | Status: DC
Start: 2017-04-17 — End: 2017-04-19
  Administered 2017-04-17 – 2017-04-19 (×4): 3.125 mg via ORAL
  Filled 2017-04-17 (×4): qty 1

## 2017-04-17 MED ORDER — DEXAMETHASONE SODIUM PHOSPHATE 10 MG/ML IJ SOLN
10.0000 mg | Freq: Once | INTRAMUSCULAR | Status: AC
  Administered 2017-04-18: 10 mg via INTRAVENOUS
  Filled 2017-04-17: qty 1

## 2017-04-17 MED ORDER — GABAPENTIN 300 MG PO CAPS
ORAL_CAPSULE | ORAL | Status: AC
  Filled 2017-04-17: qty 1

## 2017-04-17 MED ORDER — DOCUSATE SODIUM 100 MG PO CAPS
100.0000 mg | ORAL_CAPSULE | Freq: Two times a day (BID) | ORAL | Status: DC
  Administered 2017-04-17 – 2017-04-19 (×4): 100 mg via ORAL
  Filled 2017-04-17 (×4): qty 1

## 2017-04-17 MED ORDER — BISACODYL 10 MG RE SUPP: 10.0000 mg | Freq: Every day | RECTAL | Status: DC | PRN

## 2017-04-17 MED ORDER — TRANEXAMIC ACID 1000 MG/10ML IV SOLN
1000.0000 mg | Freq: Once | INTRAVENOUS | Status: AC
  Administered 2017-04-17: 17:00:00 1000 mg via INTRAVENOUS
  Filled 2017-04-17: qty 1100

## 2017-04-17 MED ORDER — LACTATED RINGERS IV SOLN
INTRAVENOUS | Status: DC
  Administered 2017-04-17 (×2): via INTRAVENOUS

## 2017-04-17 MED ORDER — ACETAMINOPHEN 650 MG RE SUPP: 650.0000 mg | Freq: Four times a day (QID) | RECTAL | Status: DC | PRN

## 2017-04-17 MED ORDER — FENTANYL CITRATE (PF) 100 MCG/2ML IJ SOLN
INTRAMUSCULAR | Status: AC
  Administered 2017-04-17: 50 ug via INTRAVENOUS
  Filled 2017-04-17: qty 2

## 2017-04-17 MED ORDER — ETOMIDATE 2 MG/ML IV SOLN
INTRAVENOUS | Status: AC
  Filled 2017-04-17: qty 10

## 2017-04-17 MED ORDER — ONDANSETRON HCL 4 MG PO TABS: 4.0000 mg | ORAL_TABLET | Freq: Four times a day (QID) | ORAL | Status: DC | PRN

## 2017-04-17 MED ORDER — METHOCARBAMOL 500 MG PO TABS
500.0000 mg | ORAL_TABLET | Freq: Four times a day (QID) | ORAL | Status: DC | PRN
  Administered 2017-04-19: 500 mg via ORAL
  Filled 2017-04-17: qty 1

## 2017-04-17 MED ORDER — EPHEDRINE SULFATE-NACL 50-0.9 MG/10ML-% IV SOSY
PREFILLED_SYRINGE | INTRAVENOUS | Status: DC | PRN
  Administered 2017-04-17 (×2): 2.5 mg via INTRAVENOUS
  Administered 2017-04-17: 5 mg via INTRAVENOUS

## 2017-04-17 MED ORDER — HYDROMORPHONE HCL-NACL 0.5-0.9 MG/ML-% IV SOSY
PREFILLED_SYRINGE | INTRAVENOUS | Status: AC
  Filled 2017-04-17: qty 2

## 2017-04-17 MED ORDER — BUPIVACAINE LIPOSOME 1.3 % IJ SUSP
20.0000 mL | Freq: Once | INTRAMUSCULAR | Status: DC
  Filled 2017-04-17: qty 20

## 2017-04-17 MED ORDER — SUCCINYLCHOLINE CHLORIDE 200 MG/10ML IV SOSY
PREFILLED_SYRINGE | INTRAVENOUS | Status: AC
  Filled 2017-04-17: qty 10

## 2017-04-17 MED ORDER — SODIUM CHLORIDE 0.9 % IJ SOLN
INTRAMUSCULAR | Status: AC
  Filled 2017-04-17: qty 50

## 2017-04-17 MED ORDER — HYDROMORPHONE HCL-NACL 0.5-0.9 MG/ML-% IV SOSY
0.2500 mg | PREFILLED_SYRINGE | INTRAVENOUS | Status: DC | PRN
  Administered 2017-04-17 (×2): 0.25 mg via INTRAVENOUS

## 2017-04-17 MED ORDER — LIDOCAINE 2% (20 MG/ML) 5 ML SYRINGE
INTRAMUSCULAR | Status: DC | PRN
  Administered 2017-04-17: 50 mg via INTRAVENOUS

## 2017-04-17 MED ORDER — SODIUM CHLORIDE 0.9 % IJ SOLN
INTRAMUSCULAR | Status: DC | PRN
  Administered 2017-04-17: 60 mL

## 2017-04-17 MED ORDER — MENTHOL 3 MG MT LOZG: 1.0000 | LOZENGE | OROMUCOSAL | Status: DC | PRN

## 2017-04-17 MED ORDER — DIPHENHYDRAMINE HCL 12.5 MG/5ML PO ELIX: 12.5000 mg | ORAL_SOLUTION | ORAL | Status: DC | PRN

## 2017-04-17 MED ORDER — RIVAROXABAN 10 MG PO TABS
10.0000 mg | ORAL_TABLET | Freq: Every day | ORAL | Status: DC
  Administered 2017-04-18 – 2017-04-19 (×2): 10 mg via ORAL
  Filled 2017-04-17 (×2): qty 1

## 2017-04-17 MED ORDER — SUCCINYLCHOLINE CHLORIDE 200 MG/10ML IV SOSY
PREFILLED_SYRINGE | INTRAVENOUS | Status: DC | PRN
  Administered 2017-04-17: 100 mg via INTRAVENOUS

## 2017-04-17 MED ORDER — ROSUVASTATIN CALCIUM 10 MG PO TABS
10.0000 mg | ORAL_TABLET | Freq: Every day | ORAL | Status: DC
  Administered 2017-04-18 – 2017-04-19 (×2): 10 mg via ORAL
  Filled 2017-04-17 (×2): qty 1

## 2017-04-17 MED ORDER — ACETAMINOPHEN 10 MG/ML IV SOLN
INTRAVENOUS | Status: AC
  Filled 2017-04-17: qty 100

## 2017-04-17 MED ORDER — ETOMIDATE 2 MG/ML IV SOLN
INTRAVENOUS | Status: DC | PRN
  Administered 2017-04-17: 12 mg via INTRAVENOUS

## 2017-04-17 MED ORDER — POLYETHYLENE GLYCOL 3350 17 G PO PACK: 17.0000 g | PACK | Freq: Every day | ORAL | Status: DC | PRN

## 2017-04-17 MED ORDER — STERILE WATER FOR IRRIGATION IR SOLN
Status: DC | PRN
  Administered 2017-04-17: 2000 mL

## 2017-04-17 MED ORDER — ACETAMINOPHEN 500 MG PO TABS
1000.0000 mg | ORAL_TABLET | Freq: Four times a day (QID) | ORAL | Status: AC
Start: 2017-04-17 — End: 2017-04-18
  Administered 2017-04-17 – 2017-04-18 (×4): 1000 mg via ORAL
  Filled 2017-04-17 (×4): qty 2

## 2017-04-17 MED ORDER — 0.9 % SODIUM CHLORIDE (POUR BTL) OPTIME
TOPICAL | Status: DC | PRN
  Administered 2017-04-17: 1000 mL

## 2017-04-17 MED ORDER — CHLORHEXIDINE GLUCONATE 4 % EX LIQD: 60.0000 mL | Freq: Once | CUTANEOUS | Status: DC

## 2017-04-17 MED ORDER — OXYCODONE HCL 5 MG PO TABS
5.0000 mg | ORAL_TABLET | ORAL | Status: DC | PRN
  Administered 2017-04-17: 10 mg via ORAL
  Filled 2017-04-17: qty 2

## 2017-04-17 MED ORDER — CEFAZOLIN SODIUM-DEXTROSE 2-4 GM/100ML-% IV SOLN
INTRAVENOUS | Status: AC
  Filled 2017-04-17: qty 100

## 2017-04-17 MED ORDER — SODIUM CHLORIDE 0.9 % IR SOLN
Status: DC | PRN
  Administered 2017-04-17: 1000 mL

## 2017-04-17 MED ORDER — ONDANSETRON HCL 4 MG/2ML IJ SOLN
4.0000 mg | Freq: Four times a day (QID) | INTRAMUSCULAR | Status: DC | PRN
  Administered 2017-04-17: 4 mg via INTRAVENOUS
  Filled 2017-04-17: qty 2

## 2017-04-17 MED ORDER — FENTANYL CITRATE (PF) 100 MCG/2ML IJ SOLN
INTRAMUSCULAR | Status: DC | PRN
Start: 2017-04-17 — End: 2017-04-17
  Administered 2017-04-17 (×5): 50 ug via INTRAVENOUS

## 2017-04-17 MED ORDER — BUPIVACAINE LIPOSOME 1.3 % IJ SUSP
INTRAMUSCULAR | Status: DC | PRN
  Administered 2017-04-17: 20 mL

## 2017-04-17 MED ORDER — TRAMADOL HCL 50 MG PO TABS
50.0000 mg | ORAL_TABLET | Freq: Four times a day (QID) | ORAL | Status: DC | PRN
  Administered 2017-04-18 (×2): 50 mg via ORAL
  Filled 2017-04-17 (×2): qty 1

## 2017-04-17 MED ORDER — TRANEXAMIC ACID 1000 MG/10ML IV SOLN
1000.0000 mg | INTRAVENOUS | Status: AC
  Administered 2017-04-17: 1000 mg via INTRAVENOUS
  Filled 2017-04-17: qty 1100

## 2017-04-17 MED ORDER — FENTANYL CITRATE (PF) 250 MCG/5ML IJ SOLN
INTRAMUSCULAR | Status: AC
  Filled 2017-04-17: qty 5

## 2017-04-17 MED ORDER — EPHEDRINE 5 MG/ML INJ
INTRAVENOUS | Status: AC
  Filled 2017-04-17: qty 10

## 2017-04-17 MED ORDER — RAMIPRIL 5 MG PO CAPS
5.0000 mg | ORAL_CAPSULE | Freq: Every day | ORAL | Status: DC
  Administered 2017-04-18 – 2017-04-19 (×2): 5 mg via ORAL
  Filled 2017-04-17 (×2): qty 1

## 2017-04-17 MED ORDER — CEFAZOLIN SODIUM-DEXTROSE 2-4 GM/100ML-% IV SOLN
2.0000 g | Freq: Four times a day (QID) | INTRAVENOUS | Status: AC
  Administered 2017-04-17 – 2017-04-18 (×2): 2 g via INTRAVENOUS
  Filled 2017-04-17: qty 100

## 2017-04-17 MED ORDER — FENTANYL CITRATE (PF) 100 MCG/2ML IJ SOLN
100.0000 ug | Freq: Once | INTRAMUSCULAR | Status: AC
Start: 2017-04-17 — End: 2017-04-17
  Administered 2017-04-17: 50 ug via INTRAVENOUS

## 2017-04-17 MED ORDER — METHOCARBAMOL 1000 MG/10ML IJ SOLN
500.0000 mg | Freq: Four times a day (QID) | INTRAMUSCULAR | Status: DC | PRN
  Administered 2017-04-17: 500 mg via INTRAVENOUS
  Filled 2017-04-17: qty 550

## 2017-04-17 MED ORDER — SODIUM CHLORIDE 0.9 % IJ SOLN
INTRAMUSCULAR | Status: AC
  Filled 2017-04-17: qty 10

## 2017-04-17 MED ORDER — GLYCOPYRROLATE 0.2 MG/ML IV SOSY
PREFILLED_SYRINGE | INTRAVENOUS | Status: AC
  Filled 2017-04-17: qty 5

## 2017-04-17 MED ORDER — ONDANSETRON HCL 4 MG/2ML IJ SOLN
INTRAMUSCULAR | Status: AC
  Filled 2017-04-17: qty 2

## 2017-04-17 MED ORDER — MIDAZOLAM HCL 2 MG/2ML IJ SOLN
2.0000 mg | Freq: Once | INTRAMUSCULAR | Status: AC
  Administered 2017-04-17: 2 mg via INTRAVENOUS

## 2017-04-17 MED ORDER — ACETAMINOPHEN 10 MG/ML IV SOLN
1000.0000 mg | Freq: Once | INTRAVENOUS | Status: AC
  Administered 2017-04-17: 1000 mg via INTRAVENOUS

## 2017-04-17 MED ORDER — ACETAMINOPHEN 325 MG PO TABS
650.0000 mg | ORAL_TABLET | Freq: Four times a day (QID) | ORAL | Status: DC | PRN
  Administered 2017-04-18 – 2017-04-19 (×2): 650 mg via ORAL
  Filled 2017-04-17 (×2): qty 2

## 2017-04-17 MED ORDER — MIDAZOLAM HCL 2 MG/2ML IJ SOLN
INTRAMUSCULAR | Status: AC
Start: 2017-04-17 — End: 2017-04-17
  Administered 2017-04-17: 2 mg via INTRAVENOUS
  Filled 2017-04-17: qty 2

## 2017-04-17 MED ORDER — GABAPENTIN 300 MG PO CAPS
300.0000 mg | ORAL_CAPSULE | Freq: Once | ORAL | Status: AC
  Administered 2017-04-17: 300 mg via ORAL

## 2017-04-17 MED ORDER — SODIUM CHLORIDE 0.9 % IV SOLN
INTRAVENOUS | Status: DC
  Administered 2017-04-17: 17:00:00 via INTRAVENOUS

## 2017-04-17 MED ORDER — FLEET ENEMA 7-19 GM/118ML RE ENEM: 1.0000 | ENEMA | Freq: Once | RECTAL | Status: DC | PRN

## 2017-04-17 MED ORDER — ONDANSETRON HCL 4 MG/2ML IJ SOLN
INTRAMUSCULAR | Status: DC | PRN
  Administered 2017-04-17: 4 mg via INTRAVENOUS

## 2017-04-17 MED ORDER — CEFAZOLIN SODIUM-DEXTROSE 2-4 GM/100ML-% IV SOLN
2.0000 g | INTRAVENOUS | Status: AC
  Administered 2017-04-17: 2 g via INTRAVENOUS

## 2017-04-17 MED ORDER — NITROGLYCERIN 0.4 MG SL SUBL: 0.4000 mg | SUBLINGUAL_TABLET | SUBLINGUAL | Status: DC | PRN

## 2017-04-17 MED ORDER — LIDOCAINE 2% (20 MG/ML) 5 ML SYRINGE
INTRAMUSCULAR | Status: AC
  Filled 2017-04-17: qty 5

## 2017-04-17 MED ORDER — DEXAMETHASONE SODIUM PHOSPHATE 10 MG/ML IJ SOLN
10.0000 mg | Freq: Once | INTRAMUSCULAR | Status: AC
  Administered 2017-04-17: 10 mg via INTRAVENOUS

## 2017-04-17 MED ORDER — PHENOL 1.4 % MT LIQD: 1.0000 | OROMUCOSAL | Status: DC | PRN

## 2017-04-17 MED ORDER — METOCLOPRAMIDE HCL 5 MG PO TABS: 5.0000 mg | ORAL_TABLET | Freq: Three times a day (TID) | ORAL | Status: DC | PRN

## 2017-04-17 SURGICAL SUPPLY — 52 items
BAG DECANTER FOR FLEXI CONT (MISCELLANEOUS) ×3 IMPLANT
BAG ZIPLOCK 12X15 (MISCELLANEOUS) ×3 IMPLANT
BANDAGE ACE 6X5 VEL STRL LF (GAUZE/BANDAGES/DRESSINGS) ×3 IMPLANT
BLADE SAG 18X100X1.27 (BLADE) ×3 IMPLANT
BLADE SAW SGTL 11.0X1.19X90.0M (BLADE) ×3 IMPLANT
BNDG CONFORM 6X.82 1P STRL (GAUZE/BANDAGES/DRESSINGS) ×3 IMPLANT
BOWL SMART MIX CTS (DISPOSABLE) ×3 IMPLANT
CAPT KNEE TOTAL 3 ATTUNE ×3 IMPLANT
CEMENT HV SMART SET (Cement) ×6 IMPLANT
CLOSURE WOUND 1/2 X4 (GAUZE/BANDAGES/DRESSINGS) ×2
COVER SURGICAL LIGHT HANDLE (MISCELLANEOUS) ×3 IMPLANT
CUFF TOURN SGL QUICK 34 (TOURNIQUET CUFF) ×2
CUFF TRNQT CYL 34X4X40X1 (TOURNIQUET CUFF) ×1 IMPLANT
DECANTER SPIKE VIAL GLASS SM (MISCELLANEOUS) ×3 IMPLANT
DRAPE U-SHAPE 47X51 STRL (DRAPES) ×3 IMPLANT
DRSG ADAPTIC 3X8 NADH LF (GAUZE/BANDAGES/DRESSINGS) ×3 IMPLANT
DRSG PAD ABDOMINAL 8X10 ST (GAUZE/BANDAGES/DRESSINGS) ×3 IMPLANT
DURAPREP 26ML APPLICATOR (WOUND CARE) ×3 IMPLANT
ELECT REM PT RETURN 15FT ADLT (MISCELLANEOUS) ×3 IMPLANT
EVACUATOR 1/8 PVC DRAIN (DRAIN) ×3 IMPLANT
GAUZE SPONGE 4X4 12PLY STRL (GAUZE/BANDAGES/DRESSINGS) ×3 IMPLANT
GLOVE BIO SURGEON STRL SZ7.5 (GLOVE) IMPLANT
GLOVE BIO SURGEON STRL SZ8 (GLOVE) ×3 IMPLANT
GLOVE BIOGEL PI IND STRL 6.5 (GLOVE) IMPLANT
GLOVE BIOGEL PI IND STRL 8 (GLOVE) ×1 IMPLANT
GLOVE BIOGEL PI INDICATOR 6.5 (GLOVE)
GLOVE BIOGEL PI INDICATOR 8 (GLOVE) ×2
GLOVE SURG SS PI 6.5 STRL IVOR (GLOVE) IMPLANT
GOWN STRL REUS W/TWL LRG LVL3 (GOWN DISPOSABLE) ×3 IMPLANT
GOWN STRL REUS W/TWL XL LVL3 (GOWN DISPOSABLE) IMPLANT
HANDPIECE INTERPULSE COAX TIP (DISPOSABLE) ×2
IMMOBILIZER KNEE 20 (SOFTGOODS) ×6 IMPLANT
IMMOBILIZER KNEE 20 THIGH 36 (SOFTGOODS) ×1 IMPLANT
MANIFOLD NEPTUNE II (INSTRUMENTS) ×3 IMPLANT
NS IRRIG 1000ML POUR BTL (IV SOLUTION) ×3 IMPLANT
PACK TOTAL KNEE CUSTOM (KITS) ×3 IMPLANT
PAD ABD 8X10 STRL (GAUZE/BANDAGES/DRESSINGS) ×3 IMPLANT
PADDING CAST COTTON 6X4 STRL (CAST SUPPLIES) ×9 IMPLANT
POSITIONER SURGICAL ARM (MISCELLANEOUS) ×3 IMPLANT
SET HNDPC FAN SPRY TIP SCT (DISPOSABLE) ×1 IMPLANT
STRIP CLOSURE SKIN 1/2X4 (GAUZE/BANDAGES/DRESSINGS) ×4 IMPLANT
SUT MNCRL AB 4-0 PS2 18 (SUTURE) ×3 IMPLANT
SUT STRATAFIX 0 PDS 27 VIOLET (SUTURE) ×3
SUT VIC AB 2-0 CT1 27 (SUTURE) ×6
SUT VIC AB 2-0 CT1 TAPERPNT 27 (SUTURE) ×3 IMPLANT
SUTURE STRATFX 0 PDS 27 VIOLET (SUTURE) ×1 IMPLANT
SYR 30ML LL (SYRINGE) ×6 IMPLANT
TAPE STRIPS DRAPE STRL (GAUZE/BANDAGES/DRESSINGS) ×3 IMPLANT
TRAY FOLEY W/METER SILVER 16FR (SET/KITS/TRAYS/PACK) ×3 IMPLANT
WATER STERILE IRR 1000ML POUR (IV SOLUTION) ×6 IMPLANT
WRAP KNEE MAXI GEL POST OP (GAUZE/BANDAGES/DRESSINGS) ×3 IMPLANT
YANKAUER SUCT BULB TIP 10FT TU (MISCELLANEOUS) ×3 IMPLANT

## 2017-04-17 NOTE — Anesthesia Postprocedure Evaluation (Signed)
Anesthesia Post Note  Patient: Madison Ramos  Procedure(s) Performed: Procedure(s) (LRB): RIGHT TOTAL KNEE ARTHROPLASTY (Right)     Patient location during evaluation: PACU Anesthesia Type: General Level of consciousness: awake Pain management: pain level controlled Vital Signs Assessment: post-procedure vital signs reviewed and stable Respiratory status: spontaneous breathing Cardiovascular status: stable Anesthetic complications: no    Last Vitals:  Vitals:   04/17/17 1624 04/17/17 1636  BP: (!) 146/75 (!) 158/79  Pulse: (!) 54 (!) 55  Resp: 19 12  Temp: 36.5 C 36.5 C  SpO2: 99% 100%    Last Pain:  Vitals:   04/17/17 1800  TempSrc:   PainSc: 2                  Daisa Stennis

## 2017-04-17 NOTE — Anesthesia Procedure Notes (Signed)
Procedure Name: Intubation Date/Time: 04/17/2017 12:26 PM Performed by: Deliah Boston Pre-anesthesia Checklist: Patient identified, Emergency Drugs available, Suction available and Patient being monitored Patient Re-evaluated:Patient Re-evaluated prior to induction Oxygen Delivery Method: Circle system utilized Preoxygenation: Pre-oxygenation with 100% oxygen Induction Type: IV induction Ventilation: Mask ventilation without difficulty Laryngoscope Size: Mac and 3 Grade View: Grade I Tube type: Oral Tube size: 7.0 mm Number of attempts: 1 Airway Equipment and Method: Stylet and Oral airway Placement Confirmation: ETT inserted through vocal cords under direct vision,  positive ETCO2 and breath sounds checked- equal and bilateral Secured at: 21 cm Tube secured with: Tape Dental Injury: Teeth and Oropharynx as per pre-operative assessment

## 2017-04-17 NOTE — Anesthesia Preprocedure Evaluation (Addendum)
Anesthesia Evaluation  Patient identified by MRN, date of birth, ID band Patient awake    Reviewed: Allergy & Precautions, NPO status , Patient's Chart, lab work & pertinent test results  Airway Mallampati: II  TM Distance: >3 FB     Dental   Pulmonary former smoker,    breath sounds clear to auscultation       Cardiovascular hypertension, + Past MI   Rhythm:Regular Rate:Normal     Neuro/Psych    GI/Hepatic negative GI ROS, Neg liver ROS,   Endo/Other  negative endocrine ROS  Renal/GU negative Renal ROS     Musculoskeletal  (+) Arthritis ,   Abdominal   Peds  Hematology   Anesthesia Other Findings   Reproductive/Obstetrics                             Anesthesia Physical Anesthesia Plan  ASA: III  Anesthesia Plan: General   Post-op Pain Management:  Regional for Post-op pain   Induction: Intravenous  PONV Risk Score and Plan: 3 and Ondansetron, Dexamethasone, Midazolam, Propofol infusion and Treatment may vary due to age or medical condition  Airway Management Planned: Oral ETT  Additional Equipment:   Intra-op Plan:   Post-operative Plan: Extubation in OR  Informed Consent: I have reviewed the patients History and Physical, chart, labs and discussed the procedure including the risks, benefits and alternatives for the proposed anesthesia with the patient or authorized representative who has indicated his/her understanding and acceptance.   Dental advisory given  Plan Discussed with: CRNA, Anesthesiologist and Surgeon  Anesthesia Plan Comments:        Anesthesia Quick Evaluation

## 2017-04-17 NOTE — Anesthesia Procedure Notes (Addendum)
Anesthesia Regional Block: Adductor canal block   Pre-Anesthetic Checklist: ,, timeout performed, Correct Patient, Correct Site, Correct Laterality, Correct Procedure, Correct Position, site marked, Risks and benefits discussed,  Surgical consent,  Pre-op evaluation,  At surgeon's request and post-op pain management  Laterality: Right     Needles:   Needle Type: Stimulator Needle - 80          Additional Needles:   Procedures: Doppler guided, Ultrasound guided,,,,,,  Narrative:  Start time: 04/17/2017 11:40 AM End time: 04/17/2017 11:59 AM Injection made incrementally with aspirations every 5 mL.  Performed by: Personally  Anesthesiologist: Dorris SinghGREEN, Madison Ramos

## 2017-04-17 NOTE — Transfer of Care (Signed)
Immediate Anesthesia Transfer of Care Note  Patient: Madison Ramos  Procedure(s) Performed: Procedure(s): RIGHT TOTAL KNEE ARTHROPLASTY (Right)  Patient Location: PACU  Anesthesia Type:GA combined with regional for post-op pain  Level of Consciousness:  sedated, patient cooperative and responds to stimulation  Airway & Oxygen Therapy:Patient Spontanous Breathing and Patient connected to face mask oxgen  Post-op Assessment:  Report given to PACU RN and Post -op Vital signs reviewed and stable  Post vital signs:  Reviewed and stable  Last Vitals:  Vitals:   04/17/17 1205 04/17/17 1210  BP: 124/66 120/71  Pulse: (!) 50 (!) 49  Resp: (!) 9 11  Temp:    SpO2: 95% 96%    Complications: No apparent anesthesia complications

## 2017-04-17 NOTE — Progress Notes (Signed)
AssistedDr. Edwards with right, ultrasound guided, adductor canal block. Side rails up, monitors on throughout procedure. See vital signs in flow sheet. Tolerated Procedure well.  

## 2017-04-17 NOTE — Interval H&P Note (Signed)
History and Physical Interval Note:  04/17/2017 9:44 AM  Madison Ramos  has presented today for surgery, with the diagnosis of Osteoarthritis Right Knee  The various methods of treatment have been discussed with the patient and family. After consideration of risks, benefits and other options for treatment, the patient has consented to  Procedure(s): RIGHT TOTAL KNEE ARTHROPLASTY (Right) as a surgical intervention .  The patient's history has been reviewed, patient examined, no change in status, stable for surgery.  I have reviewed the patient's chart and labs.  Questions were answered to the patient's satisfaction.     Loanne DrillingALUISIO,Maanasa Aderhold V

## 2017-04-17 NOTE — Op Note (Signed)
OPERATIVE REPORT-TOTAL KNEE ARTHROPLASTY   Pre-operative diagnosis- Osteoarthritis  Right knee(s)  Post-operative diagnosis- Osteoarthritis Right knee(s)  Procedure-  Right  Total Knee Arthroplasty  Surgeon- Gus Rankin. Merinda Victorino, MD  Assistant- Avel Peace, PA-C   Anesthesia-  Adductor canal block and spinal  EBL-* No blood loss amount entered *   Drains Hemovac  Tourniquet time-  Total Tourniquet Time Documented: Thigh (Right) - 40 minutes Total: Thigh (Right) - 40 minutes     Complications- None  Condition-PACU - hemodynamically stable.   Brief Clinical Note  Madison Ramos is a 76 y.o. year old female with end stage OA of her right knee with progressively worsening pain and dysfunction. She has constant pain, with activity and at rest and significant functional deficits with difficulties even with ADLs. She has had extensive non-op management including analgesics, injections of cortisone and viscosupplements, and home exercise program, but remains in significant pain with significant dysfunction.Radiographs show bone on bone arthritis medial and patellofemoral. She presents now for right Total Knee Arthroplasty.    Procedure in detail---   The patient is brought into the operating room and positioned supine on the operating table. After successful administration of  Adductor canal block and spinal,   a tourniquet is placed high on the  Right thigh(s) and the lower extremity is prepped and draped in the usual sterile fashion. Time out is performed by the operating team and then the  Right lower extremity is wrapped in Esmarch, knee flexed and the tourniquet inflated to 300 mmHg.       A midline incision is made with a ten blade through the subcutaneous tissue to the level of the extensor mechanism. A fresh blade is used to make a medial parapatellar arthrotomy. Soft tissue over the proximal medial tibia is subperiosteally elevated to the joint line with a knife and into the  semimembranosus bursa with a Cobb elevator. Soft tissue over the proximal lateral tibia is elevated with attention being paid to avoiding the patellar tendon on the tibial tubercle. The patella is everted, knee flexed 90 degrees and the ACL and PCL are removed. Findings are bone on bone medial and patellofemoral with large global osteophytes.        The drill is used to create a starting hole in the distal femur and the canal is thoroughly irrigated with sterile saline to remove the fatty contents. The 5 degree Right  valgus alignment guide is placed into the femoral canal and the distal femoral cutting block is pinned to remove 10 mm off the distal femur. Resection is made with an oscillating saw.      The tibia is subluxed forward and the menisci are removed. The extramedullary alignment guide is placed referencing proximally at the medial aspect of the tibial tubercle and distally along the second metatarsal axis and tibial crest. The block is pinned to remove 2mm off the more deficient medial  side. Resection is made with an oscillating saw. Size 5is the most appropriate size for the tibia and the proximal tibia is prepared with the modular drill and keel punch for that size.      The femoral sizing guide is placed and size 6 is most appropriate. Rotation is marked off the epicondylar axis and confirmed by creating a rectangular flexion gap at 90 degrees. The size 6 cutting block is pinned in this rotation and the anterior, posterior and chamfer cuts are made with the oscillating saw. The intercondylar block is then placed and that  cut is made.      Trial size 5 tibial component, trial size 6 narrow posterior stabilized femur and a 8  mm posterior stabilized rotating platform insert trial is placed. Full extension is achieved with excellent varus/valgus and anterior/posterior balance throughout full range of motion. The patella is everted and thickness measured to be 22  mm. Free hand resection is taken to  12 mm, a 35 template is placed, lug holes are drilled, trial patella is placed, and it tracks normally. Osteophytes are removed off the posterior femur with the trial in place. All trials are removed and the cut bone surfaces prepared with pulsatile lavage. Cement is mixed and once ready for implantation, the size 5 tibial implant, size  6 narrow posterior stabilized femoral component, and the size 35 patella are cemented in place and the patella is held with the clamp. The trial insert is placed and the knee held in full extension. The Exparel (20 ml mixed with 60 ml saline) is injected into the extensor mechanism, posterior capsule, medial and lateral gutters and subcutaneous tissues.  All extruded cement is removed and once the cement is hard the permanent 8 mm posterior stabilized rotating platform insert is placed into the tibial tray.      The wound is copiously irrigated with saline solution and the extensor mechanism closed over a hemovac drain with #1 V-loc suture. The tourniquet is released for a total tourniquet time of 40  minutes. Flexion against gravity is 140 degrees and the patella tracks normally. Subcutaneous tissue is closed with 2.0 vicryl and subcuticular with running 4.0 Monocryl. The incision is cleaned and dried and steri-strips and a bulky sterile dressing are applied. The limb is placed into a knee immobilizer and the patient is awakened and transported to recovery in stable condition.      Please note that a surgical assistant was a medical necessity for this procedure in order to perform it in a safe and expeditious manner. Surgical assistant was necessary to retract the ligaments and vital neurovascular structures to prevent injury to them and also necessary for proper positioning of the limb to allow for anatomic placement of the prosthesis.   Gus RankinFrank V. Davonne Jarnigan, MD    04/17/2017, 1:43 PM

## 2017-04-17 NOTE — H&P (View-Only) (Signed)
Madison Ramos DOB: [birth year] Married / Language: [place] / Race: White Female Date of admission: April 17, 2017 Chief complaint: right knee pain History of Present Illness  The patient is a 76 year old female who comes in for a preoperative History and Physical. The patient is scheduled for a right total knee arthroplasty to be performed by Dr. Gus RankinFrank V. Aluisio, MD at [facility] 04/17/[facility]. The patient is a 76 year old female who presented with knee complaints. The patient reports right knee symptoms including: pain (wakes pt. up at night), instability, grinding (popping) and limping which began 1 year(s) ago without any known injury. The patient describes their pain as sharp.The patient feels that the symptoms are worsening. The patient has the current diagnosis of knee osteoarthritis. This problem has not been previously evaluated. Past treatment for this problem has included intra-articular injection of corticosteroids (Dr. [name] cortisone injection about 1 year: didn't help). Symptoms are reported to be located in the right knee and right medial knee and include knee pain and medial knee pain. Symptoms are exacerbated by motion at the knee and weight bearing. Current treatment includes knee brace and nonsteroidal anti-inflammatory drugs (Tylenol). She has had a total knee on the left by Dr. [name] in the past and that has worked well, but her right knee is giving her difficulties now. It hurts in the medial aspect, crunches and grinds going up and down steps and she has had cortisone and it did not help. She is now ready to get her right knee replaced as her husband has had knee replacement by Dr. [name]. They have been treated conservatively in the past for the above stated problem and despite conservative measures, they continue to have progressive pain and severe functional limitations and dysfunction. They have failed non-operative management including home exercise,  medications, and injections. It is felt that they would benefit from undergoing total joint replacement. Risks and benefits of the procedure have been discussed with the patient and they elect to proceed with surgery. There are no active contraindications to surgery such as ongoing infection or rapidly progressive neurological disease.    Problem List/Past Medical Primary osteoarthritis of right knee (M17.11)  Heart murmur  High blood pressure  Temporal Arteritis  Varicose veins    Allergies (Alexzandrew L. Perkins, III PA-C; 03/22/2017 2:07 PM) Corticosteroids Support *DIETARY PRODUCTS/DIETARY MANAGEMENT PRODUCTS*  elevates blood pressure  Family History  Depression  Mother. Drug / Alcohol Addiction  Father. First Degree Relatives  reported Heart Disease  Father. Heart disease in female family member before age 75  Father  Deceased. age 75 Mother  Deceased. age 75  Social History  Children  3 Current work status  retired Scientist, physiologicalxercise  Exercises weekly; does running / walking and gym / [employer] Living situation  live with spouse Marital status  married Never consumed alcohol  01/27/2017: Never consumed alcohol No history of drug/alcohol rehab  Not under pain contract  Number of flights of stairs before winded  greater than 5 Tobacco / smoke exposure  01/27/2017: no Tobacco use  Never smoker. 01/27/2017 Advance Directives  Living Will. Post-Surgical Plans  Home With Family, Home, Straight to Outpatient Therapy.  Medication History Rosuvastatin Calcium (10MG  Tablet, Oral) Active. Carvedilol (3.125MG  Tablet, Oral) Active. Tylenol (325MG  Tablet, 1 (one) Oral) Active. Ramipril (5MG  Capsule, Oral) Active. Fish Oil Active. Multivitamin Active.   Past Surgical History  Carpal Tunnel Repair  right Cataract Surgery  bilateral Colon Polyp Removal - Open  Total  Knee Replacement  left Tubal Ligation    Review of Systems  General Not Present-  Chills, Fatigue, Fever, Memory Loss, Night Sweats, Weight Gain and Weight Loss. Skin Not Present- Eczema, Hives, Itching, Lesions and Rash. HEENT Not Present- Dentures, Double Vision, Headache, Hearing Loss, Tinnitus and Visual Loss. Respiratory Not Present- Allergies, Chronic Cough, Coughing up blood, Shortness of breath at rest and Shortness of breath with exertion. Cardiovascular Not Present- Chest Pain, Difficulty Breathing Lying Down, Murmur, Palpitations, Racing/skipping heartbeats and Swelling. Gastrointestinal Not Present- Abdominal Pain, Bloody Stool, Constipation, Diarrhea, Difficulty Swallowing, Heartburn, Jaundice, Loss of appetitie, Nausea and Vomiting. Female Genitourinary Not Present- Blood in Urine, Discharge, Flank Pain, Incontinence, Painful Urination, Urgency, Urinary frequency, Urinary Retention, Urinating at Night and Weak urinary stream. Musculoskeletal Present- Joint Pain. Not Present- Back Pain, Joint Swelling, Morning Stiffness, Muscle Pain, Muscle Weakness and Spasms. Neurological Not Present- Blackout spells, Difficulty with balance, Dizziness, Paralysis, Tremor and Weakness. Psychiatric Not Present- Insomnia.  Vitals  Weight: 160 lb Height: 65in Weight was reported by patient. Height was reported by patient. Body Surface Area: 1.8 m Body Mass Index: 26.63 kg/m  Pulse: 64 (Regular)  BP: 138/84 (Sitting, Left Arm, Standard)   Physical Exam  General Mental Status -Alert, cooperative and good historian. General Appearance-pleasant, Not in acute distress. Orientation-Oriented X3. Build & Nutrition-Well nourished and Well developed.  Head and Neck Head-normocephalic, atraumatic . Neck Global Assessment - supple, no bruit auscultated on the right, no bruit auscultated on the left.  Eye Vision-Wears corrective lenses. Pupil - Bilateral-Regular and Round. Motion - Bilateral-EOMI.  Chest and Lung Exam Auscultation Breath sounds -  clear at anterior chest wall and clear at posterior chest wall. Adventitious sounds - No Adventitious sounds.  Cardiovascular Auscultation Rhythm - Regular rate and rhythm. Heart Sounds - S1 WNL and S2 WNL. Murmurs & Other Heart Sounds: Murmur 1 - Location - Pulmonic Area. Timing - Early systolic. Grade - II/VI.  Abdomen Palpation/Percussion Tenderness - Abdomen is non-tender to palpation. Rigidity (guarding) - Abdomen is soft. Auscultation Auscultation of the abdomen reveals - Bowel sounds normal.  Female Genitourinary Note: Not done, not pertinent to present illness   Musculoskeletal Note: right knee shows a varus deformity, tenderness in the medial joint line, crepitation at the patellofemoral joint throughout range of motion. No ligamentous instability.  IMAGING X-rays show bone on bone medial compartment, moderately severe patellofemoral change, and moderate lateral compartment changes.     Assessment & Plan  Primary osteoarthritis of right knee (M17.11)  Note:Surgical Plans: Right Total Knee Replacement  Disposition: Home with family, Straight to outpatient  PCP: Dr. Belva Crome - Patient has been seen preoperatively and felt to be stable for surgery.  IV TXA  Anesthesia Issues: None  Patient was instructed on what medications to stop prior to surgery.  Signed electronically by Lauraine Rinne, III PA-C

## 2017-04-18 LAB — CBC
HEMATOCRIT: 38.6 % (ref 36.0–46.0)
HEMOGLOBIN: 12.8 g/dL (ref 12.0–15.0)
MCH: 29.2 pg (ref 26.0–34.0)
MCHC: 33.2 g/dL (ref 30.0–36.0)
MCV: 88.1 fL (ref 78.0–100.0)
Platelets: 152 10*3/uL (ref 150–400)
RBC: 4.38 MIL/uL (ref 3.87–5.11)
RDW: 13.6 % (ref 11.5–15.5)
WBC: 10.4 10*3/uL (ref 4.0–10.5)

## 2017-04-18 LAB — BASIC METABOLIC PANEL
Anion gap: 9 (ref 5–15)
BUN: 10 mg/dL (ref 6–20)
CHLORIDE: 107 mmol/L (ref 101–111)
CO2: 25 mmol/L (ref 22–32)
CREATININE: 0.77 mg/dL (ref 0.44–1.00)
Calcium: 8.4 mg/dL — ABNORMAL LOW (ref 8.9–10.3)
GFR calc non Af Amer: >60 mL/min (ref >60)
Glucose, Bld: 159 mg/dL — ABNORMAL HIGH (ref 65–99)
Potassium: 4.1 mmol/L (ref 3.5–5.1)
Sodium: 141 mmol/L (ref 135–145)

## 2017-04-18 MED ORDER — METHOCARBAMOL 500 MG PO TABS: 500.0000 mg | ORAL_TABLET | Freq: Four times a day (QID) | ORAL | 0 refills | Status: AC | PRN

## 2017-04-18 MED ORDER — SODIUM CHLORIDE 0.9 % IV BOLUS (SEPSIS)
250.0000 mL | Freq: Once | INTRAVENOUS | Status: AC
  Administered 2017-04-18: 09:00:00 250 mL via INTRAVENOUS

## 2017-04-18 MED ORDER — OXYCODONE HCL 5 MG PO TABS: 5.0000 mg | ORAL_TABLET | ORAL | 0 refills | Status: AC | PRN

## 2017-04-18 MED ORDER — TRAMADOL HCL 50 MG PO TABS: 50.0000 mg | ORAL_TABLET | Freq: Four times a day (QID) | ORAL | 0 refills | Status: AC | PRN

## 2017-04-18 MED ORDER — RIVAROXABAN 10 MG PO TABS: 10.0000 mg | ORAL_TABLET | Freq: Every day | ORAL | 0 refills | Status: AC

## 2017-04-18 NOTE — Evaluation (Signed)
Physical Therapy Evaluation Patient Details Name: Madison Ramos MRN: [id number] DOB: 1940/08/29 Today's Date: 04/18/2017   History of Present Illness  Pt is a 76 year old female s/p R TKA  Clinical Impression  Pt is s/p TKA resulting in the deficits listed below (see PT Problem List).  Pt will benefit from skilled PT to increase their independence and safety with mobility to allow discharge to the venue listed below.  Pt ambulated in hallway and performed LE exercises POD #1.  Pt plans to d/c home with spouse and start with OP PT.     Follow Up Recommendations Outpatient PT;DC plan and follow up therapy as arranged by surgeon    Equipment Recommendations  None recommended by PT    Recommendations for Other Services       Precautions / Restrictions Precautions Precautions: Fall;Knee Precaution Comments: able to perform SLR Restrictions Weight Bearing Restrictions: No Other Position/Activity Restrictions: WBAT      Mobility  Bed Mobility Overal bed mobility: Needs Assistance Bed Mobility: Supine to Sit     Supine to sit: Min guard     General bed mobility comments: verbal cues for technique  Transfers Overall transfer level: Needs assistance Equipment used: Rolling walker (2 wheeled) Transfers: Sit to/from Stand Sit to Stand: Min guard         General transfer comment: verbal cues for UE and LE positioning  Ambulation/Gait Ambulation/Gait assistance: Min guard Ambulation Distance (Feet): 130 Feet Assistive device: Rolling walker (2 wheeled) Gait Pattern/deviations: Step-to pattern;Decreased stance time - right;Antalgic     General Gait Details: verbal cues for sequence, RW positioning, posture, step length  Stairs            Wheelchair Mobility    Modified Rankin (Stroke Patients Only)       Balance                                             Pertinent Vitals/Pain Pain Assessment: 0-10 Pain Score: 4  Pain Location: R  knee Pain Descriptors / Indicators: Aching;Sore Pain Intervention(s): Monitored during session;Limited activity within patient's tolerance;Repositioned;Premedicated before session    Home Living Family/patient expects to be discharged to:: Private residence Living Arrangements: Spouse/significant other   Type of Home: House Home Access: Level entry     Home Layout: Able to live on main level with bedroom/bathroom Home Equipment: Walker - 2 wheels      Prior Function Level of Independence: Independent               Hand Dominance        Extremity/Trunk Assessment        Lower Extremity Assessment Lower Extremity Assessment: RLE deficits/detail RLE Deficits / Details: able to perform SLR, AAROM R knee flexion 85*       Communication   Communication: No difficulties  Cognition Arousal/Alertness: Awake/alert Behavior During Therapy: WFL for tasks assessed/performed Overall Cognitive Status: Within Functional Limits for tasks assessed                                        General Comments      Exercises Total Joint Exercises Ankle Circles/Pumps: AROM;Both;10 reps Quad Sets: AROM;10 reps;Both Gluteal Sets: AROM;Both;10 reps Short Arc Quad: AROM;Right;10 reps Heel Slides: AAROM;Right;10 reps Hip  ABduction/ADduction: AROM;Right;10 reps Straight Leg Raises: AROM;Right;10 reps   Assessment/Plan    PT Assessment Patient needs continued PT services  PT Problem List Decreased strength;Decreased range of motion;Decreased mobility;Decreased knowledge of use of DME;Pain;Decreased knowledge of precautions       PT Treatment Interventions Functional mobility training;Stair training;Gait training;Therapeutic exercise;DME instruction;Patient/family education;Therapeutic activities    PT Goals (Current goals can be found in the Care Plan section)  Acute Rehab PT Goals PT Goal Formulation: With patient Time For Goal Achievement: 04/22/17 Potential  to Achieve Goals: Good    Frequency 7X/week   Barriers to discharge        Co-evaluation               AM-PAC PT "6 Clicks" Daily Activity  Outcome Measure Difficulty turning over in bed (including adjusting bedclothes, sheets and blankets)?: None Difficulty moving from lying on back to sitting on the side of the bed? : A Lot Difficulty sitting down on and standing up from a chair with arms (e.g., wheelchair, bedside commode, etc,.)?: Unable Help needed moving to and from a bed to chair (including a wheelchair)?: A Little Help needed walking in hospital room?: A Little Help needed climbing 3-5 steps with a railing? : A Little 6 Click Score: 16    End of Session Equipment Utilized During Treatment: Gait belt Activity Tolerance: Patient tolerated treatment well Patient left: in chair;with call bell/phone within reach   PT Visit Diagnosis: Difficulty in walking, not elsewhere classified (R26.2)    Time: [id number] PT Time Calculation (min) (ACUTE ONLY): 25 min   Charges:   PT Evaluation $PT Eval Low Complexity: 1 Low PT Treatments $Therapeutic Exercise: 8-22 mins   PT G Codes:        Zenovia JarredKati Kairon Shock, PT, DPT 04/18/2017 Pager: [phone]  [name],[name] 04/18/2017, 11:43 AM

## 2017-04-18 NOTE — Progress Notes (Signed)
   Subjective: 1 Day Post-Op Procedure(s) (LRB): RIGHT TOTAL KNEE ARTHROPLASTY (Right) Patient reports pain as mild.   Patient seen in rounds with Dr. [name]. Patient is well, but has had some minor complaints of pain in the knee, requiring pain medications We will start therapy today.  Plan is to go Home after hospital stay.  Objective: Vital signs in last 24 hours: Temp:  [97.4 F (36.3 C)-97.8 F (36.6 C)] 97.5 F (36.4 C) (09/11 0542) Pulse Rate:  [46-74] 50 (09/11 0542) Resp:  [8-19] 18 (09/11 0542) BP: (97-161)/(52-89) 126/66 (09/11 0542) SpO2:  [87 %-100 %] 98 % (09/11 0542) Weight:  [79.8 kg (176 lb)] 79.8 kg (176 lb) (09/10 0929)  Intake/Output from previous day:  Intake/Output Summary (Last 24 hours) at 04/18/17 0841 Last data filed at 04/18/17 0837  Gross per 24 hour  Intake             2430 ml  Output             2335 ml  Net               95 ml    Intake/Output this shift: Total I/O In: 240 [P.O.:240] Out: -   Labs:  Recent Labs  04/18/17 0501  HGB 12.8    Recent Labs  04/18/17 0501  WBC 10.4  RBC 4.38  HCT 38.6  PLT 152    Recent Labs  04/18/17 0501  NA 141  K 4.1  CL 107  CO2 25  BUN 10  CREATININE 0.77  GLUCOSE 159*  CALCIUM 8.4*   No results for input(s): LABPT, INR in the last 72 hours.  EXAM General - Patient is Alert, Appropriate and Oriented Extremity - Neurovascular intact Sensation intact distally Intact pulses distally Dorsiflexion/Plantar flexion intact Dressing - dressing C/D/I Motor Function - intact, moving foot and toes well on exam.  Hemovac pulled without difficulty.  Past Medical History:  Diagnosis Date  . Abnormal CT scan, kidney    Mild prominence of the L kidney upper pole collecting system versus parapelvic cyst on CT 11/2011  . Abnormal TSH    11/2011  . Bicuspid aortic valve    Mild AS by echo 11/2011  . HTN (hypertension)   . NSTEMI (non-ST elevated myocardial infarction) (HCC)    Troponin of  11 11/2011 and cath 11/20/11 without evidence for significant obstructive CAD - ?etiology - no PE by CTA, question secondary to atheroemboli  . Pericardial effusion    Small, 11/2011    Assessment/Plan: 1 Day Post-Op Procedure(s) (LRB): RIGHT TOTAL KNEE ARTHROPLASTY (Right) Principal Problem:   OA (osteoarthritis) of knee  Estimated body mass index is 29.29 kg/m as calculated from the following:   Height as of this encounter: 5\' 5"  (1.651 m).   Weight as of this encounter: 79.8 kg (176 lb). Up with therapy Plan for discharge tomorrow  DVT Prophylaxis - Xarelto Weight-Bearing as tolerated to right leg D/C O2 and Pulse OX and try on Room Air  Avel Peacerew Perkins, PA-C Orthopaedic Surgery 04/18/2017, 8:41 AM

## 2017-04-18 NOTE — Discharge Summary (Signed)
Physician Discharge Summary   Patient ID: Madison Ramos MRN: [id number] DOB/AGE: July 14, 1941 76 y.o.  Admit date: 04/17/2017 Discharge date: 04/19/2017  Primary Diagnosis:  Osteoarthritis  Right knee(s)  Admission Diagnoses:  Past Medical History:  Diagnosis Date  . Abnormal CT scan, kidney    Mild prominence of the L kidney upper pole collecting system versus parapelvic cyst on CT 11/2011  . Abnormal TSH    11/2011  . Bicuspid aortic valve    Mild AS by echo 11/2011  . HTN (hypertension)   . NSTEMI (non-ST elevated myocardial infarction) ([facility])    Troponin of 11 11/2011 and cath 11/20/11 without evidence for significant obstructive CAD - ?etiology - no PE by CTA, question secondary to atheroemboli  . Pericardial effusion    Small, 11/2011   Discharge Diagnoses:   Principal Problem:   OA (osteoarthritis) of knee  Estimated body mass index is 29.29 kg/m as calculated from the following:   Height as of this encounter: _0  (1.651 m).   Weight as of this encounter: 79.8 kg (176 lb).  Procedure:  Procedure(s) (LRB): RIGHT TOTAL KNEE ARTHROPLASTY (Right)   Consults: None  HPI: Madison Ramos is a 76 y.o. year old female with end stage OA of her right knee with progressively worsening pain and dysfunction. She has constant pain, with activity and at rest and significant functional deficits with difficulties even with ADLs. She has had extensive non-op management including analgesics, injections of cortisone and viscosupplements, and home exercise program, but remains in significant pain with significant dysfunction.Radiographs show bone on bone arthritis medial and patellofemoral. She presents now for right Total Knee Arthroplasty.    Laboratory Data: Admission on 04/17/2017  Component Date Value Ref Range Status  . WBC 04/18/2017 10.4  4.0 - 10.5 K/uL Final  . RBC 04/18/2017 4.38  3.87 - 5.11 MIL/uL Final  . Hemoglobin 04/18/2017 12.8  12.0 - 15.0 g/dL Final  . HCT 04/18/2017 38.6   36.0 - 46.0 % Final  . MCV 04/18/2017 88.1  78.0 - 100.0 fL Final  . MCH 04/18/2017 29.2  26.0 - 34.0 pg Final  . MCHC 04/18/2017 33.2  30.0 - 36.0 g/dL Final  . RDW 04/18/2017 13.6  11.5 - 15.5 % Final  . Platelets 04/18/2017 152  150 - 400 K/uL Final  . Sodium 04/18/2017 141  135 - 145 mmol/L Final  . Potassium 04/18/2017 4.1  3.5 - 5.1 mmol/L Final  . Chloride 04/18/2017 107  101 - 111 mmol/L Final  . CO2 04/18/2017 25  22 - 32 mmol/L Final  . Glucose, Bld 04/18/2017 159* 65 - 99 mg/dL Final  . BUN 04/18/2017 10  6 - 20 mg/dL Final  . Creatinine, Ser 04/18/2017 0.77  0.44 - 1.00 mg/dL Final  . Calcium 04/18/2017 8.4* 8.9 - 10.3 mg/dL Final  . GFR calc non Af Amer 04/18/2017 >60  >60 mL/min Final  . GFR calc Af Amer 04/18/2017 >60  >60 mL/min Final   Comment: (NOTE) The eGFR has been calculated using the CKD EPI equation. This calculation has not been validated in all clinical situations. eGFR's persistently <60 mL/min signify possible Chronic Kidney Disease.   Georgiann Hahn gap 04/18/2017 9  5 - 15 Final  Hospital Outpatient Visit on 04/07/2017  Component Date Value Ref Range Status  . aPTT 04/07/2017 28  24 - 36 seconds Final  . WBC 04/07/2017 8.1  4.0 - 10.5 K/uL Final  . RBC 04/07/2017 4.79  3.87 -  5.11 MIL/uL Final  . Hemoglobin 04/07/2017 14.3  12.0 - 15.0 g/dL Final  . HCT 04/07/2017 42.4  36.0 - 46.0 % Final  . MCV 04/07/2017 88.5  78.0 - 100.0 fL Final  . MCH 04/07/2017 29.9  26.0 - 34.0 pg Final  . MCHC 04/07/2017 33.7  30.0 - 36.0 g/dL Final  . RDW 04/07/2017 13.8  11.5 - 15.5 % Final  . Platelets 04/07/2017 187  150 - 400 K/uL Final  . Sodium 04/07/2017 142  135 - 145 mmol/L Final  . Potassium 04/07/2017 5.2* 3.5 - 5.1 mmol/L Final  . Chloride 04/07/2017 109  101 - 111 mmol/L Final  . CO2 04/07/2017 28  22 - 32 mmol/L Final  . Glucose, Bld 04/07/2017 118* 65 - 99 mg/dL Final  . BUN 04/07/2017 11  6 - 20 mg/dL Final  . Creatinine, Ser 04/07/2017 0.93  0.44 - 1.00 mg/dL  Final  . Calcium 04/07/2017 9.2  8.9 - 10.3 mg/dL Final  . Total Protein 04/07/2017 7.1  6.5 - 8.1 g/dL Final  . Albumin 04/07/2017 4.2  3.5 - 5.0 g/dL Final  . AST 04/07/2017 20  15 - 41 U/L Final  . ALT 04/07/2017 9* 14 - 54 U/L Final  . Alkaline Phosphatase 04/07/2017 64  38 - 126 U/L Final  . Total Bilirubin 04/07/2017 0.6  0.3 - 1.2 mg/dL Final  . GFR calc non Af Amer 04/07/2017 59* >60 mL/min Final  . GFR calc Af Amer 04/07/2017 >60  >60 mL/min Final   Comment: (NOTE) The eGFR has been calculated using the CKD EPI equation. This calculation has not been validated in all clinical situations. eGFR's persistently <60 mL/min signify possible Chronic Kidney Disease.   . Anion gap 04/07/2017 5  5 - 15 Final  . Prothrombin Time 04/07/2017 14.6  11.4 - 15.2 seconds Final  . INR 04/07/2017 1.15   Final  . ABO/RH(D) 04/07/2017 O NEG   Final  . Antibody Screen 04/07/2017 NEG   Final  . Sample Expiration 04/07/2017 04/20/2017   Final  . Extend sample reason 04/07/2017 NO TRANSFUSIONS OR PREGNANCY IN THE PAST 3 MONTHS   Final  . MRSA, PCR 04/07/2017 NEGATIVE  NEGATIVE Final  . Staphylococcus aureus 04/07/2017 NEGATIVE  NEGATIVE Final   Comment: (NOTE) The Xpert SA Assay (FDA approved for NASAL specimens in patients 19 years of age and older), is one component of a comprehensive surveillance program. It is not intended to diagnose infection nor to guide or monitor treatment.   . ABO/RH(D) 04/07/2017 O NEG   Final     X-Rays:No results found.  EKG: Orders placed or performed in visit on 03/04/14  . EKG 12-Lead     Hospital Course: Madison Ramos is a 76 y.o. who was admitted to [facility]. They were brought to the operating room on 04/17/2017 and underwent Procedure(s): RIGHT TOTAL KNEE ARTHROPLASTY.  Patient tolerated the procedure well and was later transferred to the recovery room and then to the orthopaedic floor for postoperative care.  They were given PO and IV  analgesics for pain control following their surgery.  They were given 24 hours of postoperative antibiotics of  Anti-infectives    Start     Dose/Rate Route Frequency Ordered Stop   04/17/17 2000  ceFAZolin (ANCEF) IVPB 2g/100 mL premix     2 g 200 mL/hr over 30 Minutes Intravenous Every 6 hours 04/17/17 1646 04/18/17 0325   04/17/17 0904  ceFAZolin (ANCEF) 2-4 GM/100ML-%  IVPB    Comments:  Algis Liming   : cabinet override      04/17/17 0904 04/17/17 1235   04/17/17 0902  ceFAZolin (ANCEF) IVPB 2g/100 mL premix     2 g 200 mL/hr over 30 Minutes Intravenous On call to O.R. 04/17/17 0902 04/17/17 1235     and started on DVT prophylaxis in the form of Xarelto.   PT and OT were ordered for total joint protocol.  Discharge planning consulted to help with postop disposition and equipment needs.  Patient had a decnt night on the evening of surgery.  They started to get up OOB with therapy on day one. Hemovac drain was pulled without difficulty.  Continued to work with therapy into day two.  Dressing was changed on day two and the incision was healing well. Patient was seen in rounds and was ready to go home.  Discharge home - straight to outpatient Diet - Cardiac diet Follow up - in 2 weeks Activity - WBAT Disposition - Home Condition Upon Discharge - Stable D/C Meds - See DC Summary DVT Prophylaxis - Xarelto  Discharge Instructions    Call MD / Call 911    Complete by:  As directed    If you experience chest pain or shortness of breath, CALL 911 and be transported to the hospital emergency room.  If you develope a fever above 101 F, pus (white drainage) or increased drainage or redness at the wound, or calf pain, call your surgeon's office.   Change dressing    Complete by:  As directed    Change dressing daily with sterile 4 x 4 inch gauze dressing and apply TED hose. Do not submerge the incision under water.   Constipation Prevention    Complete by:  As directed    Drink plenty of  fluids.  Prune juice may be helpful.  You may use a stool softener, such as Colace (over the counter) 100 mg twice a day.  Use MiraLax (over the counter) for constipation as needed.   Diet - low sodium heart healthy    Complete by:  As directed    Discharge instructions    Complete by:  As directed    Take Xarelto for two and a half more weeks, then discontinue Xarelto.  Pick up stool softner and laxative for home use following surgery while on pain medications. Do not submerge incision under water. Please use good hand washing techniques while changing dressing each day. May shower starting three days after surgery. Please use a clean towel to pat the incision dry following showers. Continue to use ice for pain and swelling after surgery. Do not use any lotions or creams on the incision until instructed by your surgeon.  Wear both TED hose on both legs during the day every day for three weeks, but may remove the TED hose at night at home.  Postoperative Constipation Protocol  Constipation - defined medically as fewer than three stools per week and severe constipation as less than one stool per week.  One of the most common issues patients have following surgery is constipation.  Even if you have a regular bowel pattern at home, your normal regimen is likely to be disrupted due to multiple reasons following surgery.  Combination of anesthesia, postoperative narcotics, change in appetite and fluid intake all can affect your bowels.  In order to avoid complications following surgery, here are some recommendations in order to help you during your recovery period.  Colace (  docusate) - Pick up an over-the-counter form of Colace or another stool softener and take twice a day as long as you are requiring postoperative pain medications.  Take with a full glass of water daily.  If you experience loose stools or diarrhea, hold the colace until you stool forms back up.  If your symptoms do not get better  within 1 week or if they get worse, check with your doctor.  Dulcolax (bisacodyl) - Pick up over-the-counter and take as directed by the product packaging as needed to assist with the movement of your bowels.  Take with a full glass of water.  Use this product as needed if not relieved by Colace only.   MiraLax (polyethylene glycol) - Pick up over-the-counter to have on hand.  MiraLax is a solution that will increase the amount of water in your bowels to assist with bowel movements.  Take as directed and can mix with a glass of water, juice, soda, coffee, or tea.  Take if you go more than two days without a movement. Do not use MiraLax more than once per day. Call your doctor if you are still constipated or irregular after using this medication for 7 days in a row.  If you continue to have problems with postoperative constipation, please contact the office for further assistance and recommendations.  If you experience "the worst abdominal pain ever" or develop nausea or vomiting, please contact the office immediatly for further recommendations for treatment.   Do not put a pillow under the knee. Place it under the heel.    Complete by:  As directed    Do not sit on low chairs, stoools or toilet seats, as it may be difficult to get up from low surfaces    Complete by:  As directed    Driving restrictions    Complete by:  As directed    No driving until released by the physician.   Increase activity slowly as tolerated    Complete by:  As directed    Lifting restrictions    Complete by:  As directed    No lifting until released by the physician.   Patient may shower    Complete by:  As directed    You may shower without a dressing once there is no drainage.  Do not wash over the wound.  If drainage remains, do not shower until drainage stops.   TED hose    Complete by:  As directed    Use stockings (TED hose) for 3 weeks on both leg(s).  You may remove them at night for sleeping.   Weight  bearing as tolerated    Complete by:  As directed    Laterality:  right   Extremity:  Lower     Allergies as of 04/18/2017      Reactions   Other    Steroids Raises blood pressure      Medication List    STOP taking these medications   amoxicillin 500 MG tablet Commonly known as:  AMOXIL   Fish Oil 1000 MG Caps   multivitamin with minerals Tabs tablet   naproxen sodium 220 MG tablet Commonly known as:  ANAPROX     TAKE these medications   carvedilol 3.125 MG tablet Commonly known as:  COREG Take 3.125 mg by mouth 2 (two) times daily with a meal.   methocarbamol 500 MG tablet Commonly known as:  ROBAXIN Take 1 tablet (500 mg total) by mouth every 6 (six)  hours as needed for muscle spasms.   nitroGLYCERIN 0.4 MG SL tablet Commonly known as:  NITROSTAT Place 1 tablet (0.4 mg total) under the tongue every 5 (five) minutes x 3 doses as needed for chest pain.   oxyCODONE 5 MG immediate release tablet Commonly known as:  Oxy IR/ROXICODONE Take 1-2 tablets (5-10 mg total) by mouth every 4 (four) hours as needed for moderate pain or severe pain.   ramipril 5 MG capsule Commonly known as:  ALTACE Take 5 mg by mouth daily.   rivaroxaban 10 MG Tabs tablet Commonly known as:  XARELTO Take 1 tablet (10 mg total) by mouth daily with breakfast. Take Xarelto for two and a half more weeks following discharge from the hospital, then discontinue Xarelto. Once the patient has completed the blood thinner regimen, then take a Baby 81 mg Aspirin daily for three more weeks.   rosuvastatin 10 MG tablet Commonly known as:  CRESTOR Take 10 mg by mouth daily.   traMADol 50 MG tablet Commonly known as:  ULTRAM Take 1-2 tablets (50-100 mg total) by mouth every 6 (six) hours as needed for moderate pain.            Discharge Care Instructions        Start     Ordered   04/19/17 0000  rivaroxaban (XARELTO) 10 MG TABS tablet  Daily with breakfast    Question:  Supervising Provider   Answer:  Gaynelle Arabian   04/18/17 2141   04/18/17 0000  methocarbamol (ROBAXIN) 500 MG tablet  Every 6 hours PRN    Question:  Supervising Provider  Answer:  Gaynelle Arabian   04/18/17 2141   04/18/17 0000  oxyCODONE (OXY IR/ROXICODONE) 5 MG immediate release tablet  Every 4 hours PRN    Question:  Supervising Provider  Answer:  Gaynelle Arabian   04/18/17 2141   04/18/17 0000  traMADol (ULTRAM) 50 MG tablet  Every 6 hours PRN    Question:  Supervising Provider  Answer:  Gaynelle Arabian   04/18/17 2141   04/18/17 0000  Call MD / Call 911    Comments:  If you experience chest pain or shortness of breath, CALL 911 and be transported to the hospital emergency room.  If you develope a fever above 101 F, pus (white drainage) or increased drainage or redness at the wound, or calf pain, call your surgeon's office.   04/18/17 2141   04/18/17 0000  Discharge instructions    Comments:  Take Xarelto for two and a half more weeks, then discontinue Xarelto.  Pick up stool softner and laxative for home use following surgery while on pain medications. Do not submerge incision under water. Please use good hand washing techniques while changing dressing each day. May shower starting three days after surgery. Please use a clean towel to pat the incision dry following showers. Continue to use ice for pain and swelling after surgery. Do not use any lotions or creams on the incision until instructed by your surgeon.  Wear both TED hose on both legs during the day every day for three weeks, but may remove the TED hose at night at home.  Postoperative Constipation Protocol  Constipation - defined medically as fewer than three stools per week and severe constipation as less than one stool per week.  One of the most common issues patients have following surgery is constipation.  Even if you have a regular bowel pattern at home, your normal regimen is likely to be  disrupted due to multiple reasons following  surgery.  Combination of anesthesia, postoperative narcotics, change in appetite and fluid intake all can affect your bowels.  In order to avoid complications following surgery, here are some recommendations in order to help you during your recovery period.  Colace (docusate) - Pick up an over-the-counter form of Colace or another stool softener and take twice a day as long as you are requiring postoperative pain medications.  Take with a full glass of water daily.  If you experience loose stools or diarrhea, hold the colace until you stool forms back up.  If your symptoms do not get better within 1 week or if they get worse, check with your doctor.  Dulcolax (bisacodyl) - Pick up over-the-counter and take as directed by the product packaging as needed to assist with the movement of your bowels.  Take with a full glass of water.  Use this product as needed if not relieved by Colace only.   MiraLax (polyethylene glycol) - Pick up over-the-counter to have on hand.  MiraLax is a solution that will increase the amount of water in your bowels to assist with bowel movements.  Take as directed and can mix with a glass of water, juice, soda, coffee, or tea.  Take if you go more than two days without a movement. Do not use MiraLax more than once per day. Call your doctor if you are still constipated or irregular after using this medication for 7 days in a row.  If you continue to have problems with postoperative constipation, please contact the office for further assistance and recommendations.  If you experience "the worst abdominal pain ever" or develop nausea or vomiting, please contact the office immediatly for further recommendations for treatment.   04/18/17 2141   04/18/17 0000  Diet - low sodium heart healthy     04/18/17 2141   04/18/17 0000  Constipation Prevention    Comments:  Drink plenty of fluids.  Prune juice may be helpful.  You may use a stool softener, such as Colace (over the counter) 100 mg  twice a day.  Use MiraLax (over the counter) for constipation as needed.   04/18/17 2141   04/18/17 0000  Increase activity slowly as tolerated     04/18/17 2141   04/18/17 0000  Patient may shower    Comments:  You may shower without a dressing once there is no drainage.  Do not wash over the wound.  If drainage remains, do not shower until drainage stops.   04/18/17 2141   04/18/17 0000  Weight bearing as tolerated    Question Answer Comment  Laterality right   Extremity Lower      04/18/17 2141   04/18/17 0000  Driving restrictions    Comments:  No driving until released by the physician.   04/18/17 2141   04/18/17 0000  Lifting restrictions    Comments:  No lifting until released by the physician.   04/18/17 2141   04/18/17 0000  TED hose    Comments:  Use stockings (TED hose) for 3 weeks on both leg(s).  You may remove them at night for sleeping.   04/18/17 2141   04/18/17 0000  Change dressing    Comments:  Change dressing daily with sterile 4 x 4 inch gauze dressing and apply TED hose. Do not submerge the incision under water.   04/18/17 2141   04/18/17 0000  Do not put a pillow under the knee. Place it  under the heel.     04/18/17 2141   04/18/17 0000  Do not sit on low chairs, stoools or toilet seats, as it may be difficult to get up from low surfaces     04/18/17 2141     Follow-up Information    Gaynelle Arabian, MD. Schedule an appointment as soon as possible for a visit on 05/02/2017.   Specialty:  Orthopedic Surgery Contact information: [address] [place] [address] [phone]           Signed: Arlee Muslim, PA-C Orthopaedic Surgery 04/18/2017, 9:42 PM

## 2017-04-18 NOTE — Evaluation (Signed)
Occupational Therapy Evaluation Patient Details Name: Madison Ramos MRN: [id number] DOB: [birth year] Today's Date: 04/18/2017    History of Present Illness Pt is a 76 year old female s/p R TKA   Clinical Impression   Pt was admitted for the above. All education was completed. No further OT is needed at this time    Follow Up Recommendations  Supervision/Assistance - 24 hour    Equipment Recommendations  None recommended by OT    Recommendations for Other Services       Precautions / Restrictions Precautions Precautions: Fall;Knee Precaution Comments: able to perform SLR Restrictions Weight Bearing Restrictions: No Other Position/Activity Restrictions: WBAT      Mobility Bed Mobility Overal bed mobility: Needs Assistance Bed Mobility: Supine to Sit     Supine to sit: Min guard     General bed mobility comments: oob   Transfers Overall transfer level: Needs assistance Equipment used: Rolling walker (2 wheeled) Transfers: Sit to/from Stand Sit to Stand: Min guard         General transfer comment: verbal cues for UE and LE positioning and safety    Balance                                           ADL either performed or assessed with clinical judgement   ADL Overall ADL's : Needs assistance/impaired     Grooming: Wash/dry hands;Supervision/safety;Standing   Upper Body Bathing: Set up;Sitting   Lower Body Bathing: Min guard;Sit to/from stand   Upper Body Dressing : Set up;Sitting   Lower Body Dressing: Minimal assistance;Sit to/from stand   Toilet Transfer: Min guard;Ambulation;BSC;RW   Toileting- Clothing Manipulation and Hygiene: Min guard;Sitting/lateral lean         General ADL Comments: pt moves quickly; cues for safety for hand placement and to be sure to touch surface against legs prior to sitting. Pt verbalizes understanding     Vision         Perception     Praxis      Pertinent Vitals/Pain Pain  Assessment: 0-10 Pain Score: 4  Pain Location: R knee Pain Descriptors / Indicators: Aching;Sore Pain Intervention(s): Limited activity within patient's tolerance;Monitored during session;Premedicated before session;Repositioned;Ice applied     Hand Dominance     Extremity/Trunk Assessment Upper Extremity Assessment Upper Extremity Assessment: Overall WFL for tasks assessed          Communication Communication Communication: No difficulties   Cognition Arousal/Alertness: Awake/alert Behavior During Therapy: WFL for tasks assessed/performed Overall Cognitive Status: Within Functional Limits for tasks assessed                                     General Comments       Exercises    Shoulder Instructions      Home Living Family/patient expects to be discharged to:: Private residence Living Arrangements: Spouse/significant other Available Help at Discharge: Family Type of Home: House Home Access: Level entry     Home Layout: Able to live on main level with bedroom/bathroom     Bathroom Shower/Tub: Chief Strategy Officer: Standard     Home Equipment: Bedside commode          Prior Functioning/Environment Level of Independence: Independent  OT Problem List:        OT Treatment/Interventions:      OT Goals(Current goals can be found in the care plan section) Acute Rehab OT Goals Patient Stated Goal: return to independence OT Goal Formulation: All assessment and education complete, DC therapy  OT Frequency:     Barriers to D/C:            Co-evaluation              AM-PAC PT "6 Clicks" Daily Activity     Outcome Measure Help from another person eating meals?: None Help from another person taking care of personal grooming?: A Little Help from another person toileting, which includes using toliet, bedpan, or urinal?: A Little Help from another person bathing (including washing, rinsing, drying)?:  A Little Help from another person to put on and taking off regular upper body clothing?: A Little Help from another person to put on and taking off regular lower body clothing?: A Little 6 Click Score: 19   End of Session CPM Right Knee CPM Right Knee: Off  Activity Tolerance: Patient tolerated treatment well Patient left: in chair;with call bell/phone within reach;with family/visitor present  OT Visit Diagnosis: Pain Pain - Right/Left: Right Pain - part of body: Knee                Time: [id number] OT Time Calculation (min): 18 min Charges:  OT General Charges $OT Visit: 1 Visit OT Evaluation $OT Eval Low Complexity: 1 Low G-Codes:     DunwoodyMaryellen Abdou Stocks, OTR/L [phone] 04/18/2017  Kimberli Winne 04/18/2017, 12:21 PM

## 2017-04-18 NOTE — Care Management Note (Signed)
Case Management Note  Patient Details  Name: Particia JasperLois M Damiani MRN: [id number] Date of Birth: 03/05/1941  Subjective/Objective:75 yo f admitted w/OA R knee. POD#1 R TKR. From home. Has cane,rw,3n1. PT-recc otpt PT.Patient [facility]. No further CM needs.                    Action/Plan:d/c plan home.   Expected Discharge Date:  04/19/17               Expected Discharge Plan:  OP Rehab  In-House Referral:     Discharge planning Services  CM Consult  Post Acute Care Choice:  Durable Medical Equipment (cane, rw) Choice offered to:     DME Arranged:    DME Agency:     HH Arranged:    HH Agency:     Status of Service:  Completed, signed off  If discussed at [employer] Length of Stay Meetings, dates discussed:    Additional Comments:  Lanier ClamMahabir, Doctor Sheahan, RN 04/18/2017, 11:51 AM

## 2017-04-18 NOTE — Progress Notes (Signed)
Physical Therapy Treatment Patient Details Name: Madison Ramos MRN: [id number] DOB: 09-29-1940 Today's Date: 04/18/2017    History of Present Illness Pt is a 76 year old female s/p R TKA    PT Comments    Pt ambulated in hallway and progressing well.  Pt plans to d/c home tomorrow.  Follow Up Recommendations  Outpatient PT;DC plan and follow up therapy as arranged by surgeon     Equipment Recommendations  None recommended by PT    Recommendations for Other Services       Precautions / Restrictions Precautions Precautions: Fall;Knee Precaution Comments: able to perform SLR Restrictions Other Position/Activity Restrictions: WBAT    Mobility  Bed Mobility Overal bed mobility: Needs Assistance Bed Mobility: Supine to Sit     Supine to sit: Min guard     General bed mobility comments: verbal cues for technique  Transfers Overall transfer level: Needs assistance Equipment used: Rolling walker (2 wheeled) Transfers: Sit to/from Stand Sit to Stand: Min guard         General transfer comment: verbal cues for UE and LE positioning  Ambulation/Gait Ambulation/Gait assistance: Min guard Ambulation Distance (Feet): 180 Feet Assistive device: Rolling walker (2 wheeled) Gait Pattern/deviations: Step-to pattern;Decreased stance time - right;Antalgic     General Gait Details: verbal cues for RW positioning, posture, step length   Stairs            Wheelchair Mobility    Modified Rankin (Stroke Patients Only)       Balance                                            Cognition Arousal/Alertness: Awake/alert Behavior During Therapy: WFL for tasks assessed/performed Overall Cognitive Status: Within Functional Limits for tasks assessed                                        Exercises Total Joint Exercises Ankle Circles/Pumps: AROM;Both;10 reps Quad Sets: AROM;10 reps;Both Gluteal Sets: AROM;Both;10 reps Short Arc  Quad: AROM;Right;10 reps Heel Slides: AAROM;Right;10 reps Hip ABduction/ADduction: AROM;Right;10 reps Straight Leg Raises: AROM;Right;10 reps    General Comments        Pertinent Vitals/Pain Pain Assessment: 0-10 Pain Score: 6  Pain Location: R knee Pain Descriptors / Indicators: Aching;Sore Pain Intervention(s): Limited activity within patient's tolerance;Monitored during session;Repositioned    Home Living Family/patient expects to be discharged to:: Private residence Living Arrangements: Spouse/significant other Available Help at Discharge: Family         Home Equipment: Bedside commode      Prior Function Level of Independence: Independent          PT Goals (current goals can now be found in the care plan section) Acute Rehab PT Goals Patient Stated Goal: return to independence PT Goal Formulation: With patient Time For Goal Achievement: 04/22/17 Potential to Achieve Goals: Good Progress towards PT goals: Progressing toward goals    Frequency    7X/week      PT Plan Current plan remains appropriate    Co-evaluation              AM-PAC PT "6 Clicks" Daily Activity  Outcome Measure  Difficulty turning over in bed (including adjusting bedclothes, sheets and blankets)?: None Difficulty moving from lying on back to  sitting on the side of the bed? : None Difficulty sitting down on and standing up from a chair with arms (e.g., wheelchair, bedside commode, etc,.)?: A Little Help needed moving to and from a bed to chair (including a wheelchair)?: A Little Help needed walking in hospital room?: A Little Help needed climbing 3-5 steps with a railing? : A Little 6 Click Score: 20    End of Session Equipment Utilized During Treatment: Gait belt Activity Tolerance: Patient tolerated treatment well Patient left: with call bell/phone within reach;in bed   PT Visit Diagnosis: Difficulty in walking, not elsewhere classified (R26.2)     Time:  6295-2841 PT Time Calculation (min) (ACUTE ONLY): 12 min  Charges:  $Gait Training: 8-22 mins                    G Codes:       Zenovia Jarred, PT, DPT 04/18/2017 Pager: [phone]  Maida Sale E 04/18/2017, 3:40 PM

## 2017-04-18 NOTE — Discharge Instructions (Signed)
° °Dr. Frank Aluisio °Total Joint Specialist °[facility] °[address] °[place], [place] [address] °[phone] ° °TOTAL KNEE REPLACEMENT POSTOPERATIVE DIRECTIONS ° °Knee Rehabilitation, Guidelines Following Surgery  °Results after knee surgery are often greatly improved when you follow the exercise, range of motion and muscle strengthening exercises prescribed by your doctor. Safety measures are also important to protect the knee from further injury. Any time any of these exercises cause you to have increased pain or swelling in your knee joint, decrease the amount until you are comfortable again and slowly increase them. If you have problems or questions, call your caregiver or physical therapist for advice.  ° °HOME CARE INSTRUCTIONS  °Remove items at home which could result in a fall. This includes throw rugs or furniture in walking pathways.  °· ICE to the affected knee every three hours for 30 minutes at a time and then as needed for pain and swelling.  Continue to use ice on the knee for pain and swelling from surgery. You may notice swelling that will progress down to the foot and ankle.  This is normal after surgery.  Elevate the leg when you are not up walking on it.   °· Continue to use the breathing machine which will help keep your temperature down.  It is common for your temperature to cycle up and down following surgery, especially at night when you are not up moving around and exerting yourself.  The breathing machine keeps your lungs expanded and your temperature down. °· Do not place pillow under knee, focus on keeping the knee straight while resting ° °DIET °You may resume your previous home diet once your are discharged from the hospital. ° °DRESSING / WOUND CARE / SHOWERING °You may shower 3 days after surgery, but keep the wounds dry during showering.  You may use an occlusive plastic wrap (Press'n Seal for example), NO SOAKING/SUBMERGING IN THE BATHTUB.  If the  bandage gets wet, change with a clean dry gauze.  If the incision gets wet, pat the wound dry with a clean towel. °You may start showering once you are discharged home but do not submerge the incision under water. Just pat the incision dry and apply a dry gauze dressing on daily. °Change the surgical dressing daily and reapply a dry dressing each time. ° °ACTIVITY °Walk with your walker as instructed. °Use walker as long as suggested by your caregivers. °Avoid periods of inactivity such as sitting longer than an hour when not asleep. This helps prevent blood clots.  °You may resume a sexual relationship in one month or when given the OK by your doctor.  °You may return to work once you are cleared by your doctor.  °Do not drive a car for 6 weeks or until released by you surgeon.  °Do not drive while taking narcotics. ° °WEIGHT BEARING °Weight bearing as tolerated with assist device (walker, cane, etc) as directed, use it as long as suggested by your surgeon or therapist, typically at least 4-6 weeks. ° °POSTOPERATIVE CONSTIPATION PROTOCOL °Constipation - defined medically as fewer than three stools per week and severe constipation as less than one stool per week. ° °One of the most common issues patients have following surgery is constipation.  Even if you have a regular bowel pattern at home, your normal regimen is likely to be disrupted due to multiple reasons following surgery.  Combination of anesthesia, postoperative narcotics, change in appetite and fluid intake all can affect your bowels.    In order to avoid complications following surgery, here are some recommendations in order to help you during your recovery period. ° °Colace (docusate) - Pick up an over-the-counter form of Colace or another stool softener and take twice a day as long as you are requiring postoperative pain medications.  Take with a full glass of water daily.  If you experience loose stools or diarrhea, hold the colace until you stool forms  back up.  If your symptoms do not get better within 1 week or if they get worse, check with your doctor. ° °Dulcolax (bisacodyl) - Pick up over-the-counter and take as directed by the product packaging as needed to assist with the movement of your bowels.  Take with a full glass of water.  Use this product as needed if not relieved by Colace only.  ° °MiraLax (polyethylene glycol) - Pick up over-the-counter to have on hand.  MiraLax is a solution that will increase the amount of water in your bowels to assist with bowel movements.  Take as directed and can mix with a glass of water, juice, soda, coffee, or tea.  Take if you go more than two days without a movement. °Do not use MiraLax more than once per day. Call your doctor if you are still constipated or irregular after using this medication for 7 days in a row. ° °If you continue to have problems with postoperative constipation, please contact the office for further assistance and recommendations.  If you experience "the worst abdominal pain ever" or develop nausea or vomiting, please contact the office immediatly for further recommendations for treatment. ° °ITCHING ° If you experience itching with your medications, try taking only a single pain pill, or even half a pain pill at a time.  You can also use Benadryl over the counter for itching or also to help with sleep.  ° °TED HOSE STOCKINGS °Wear the elastic stockings on both legs for three weeks following surgery during the day but you may remove then at night for sleeping. ° °MEDICATIONS °See your medication summary on the “After Visit Summary” that the nursing staff will review with you prior to discharge.  You may have some home medications which will be placed on hold until you complete the course of blood thinner medication.  It is important for you to complete the blood thinner medication as prescribed by your surgeon.  Continue your approved medications as instructed at time of  discharge. ° °PRECAUTIONS °If you experience chest pain or shortness of breath - call 911 immediately for transfer to the hospital emergency department.  °If you develop a fever greater that 101 F, purulent drainage from wound, increased redness or drainage from wound, foul odor from the wound/dressing, or calf pain - CONTACT YOUR SURGEON.   °                                                °FOLLOW-UP APPOINTMENTS °Make sure you keep all of your appointments after your operation with your surgeon and caregivers. You should call the office at the above phone number and make an appointment for approximately two weeks after the date of your surgery or on the date instructed by your surgeon outlined in the "After Visit Summary". ° ° °RANGE OF MOTION AND STRENGTHENING EXERCISES  °Rehabilitation of the knee is important following a knee injury or   an operation. After just a few days of immobilization, the muscles of the thigh which control the knee become weakened and shrink (atrophy). Knee exercises are designed to build up the tone and strength of the thigh muscles and to improve knee motion. Often times heat used for twenty to thirty minutes before working out will loosen up your tissues and help with improving the range of motion but do not use heat for the first two weeks following surgery. These exercises can be done on a training (exercise) mat, on the floor, on a table or on a bed. Use what ever works the best and is most comfortable for you Knee exercises include:  °Leg Lifts - While your knee is still immobilized in a splint or cast, you can do straight leg raises. Lift the leg to 60 degrees, hold for 3 sec, and slowly lower the leg. Repeat 10-20 times 2-3 times daily. Perform this exercise against resistance later as your knee gets better.  °Quad and Hamstring Sets - Tighten up the muscle on the front of the thigh (Quad) and hold for 5-10 sec. Repeat this 10-20 times hourly. Hamstring sets are done by pushing the  foot backward against an object and holding for 5-10 sec. Repeat as with quad sets.  °· Leg Slides: Lying on your back, slowly slide your foot toward your buttocks, bending your knee up off the floor (only go as far as is comfortable). Then slowly slide your foot back down until your leg is flat on the floor again. °· Angel Wings: Lying on your back spread your legs to the side as far apart as you can without causing discomfort.  °A rehabilitation program following serious knee injuries can speed recovery and prevent re-injury in the future due to weakened muscles. Contact your doctor or a physical therapist for more information on knee rehabilitation.  ° °IF YOU ARE TRANSFERRED TO A SKILLED REHAB FACILITY °If the patient is transferred to a skilled rehab facility following release from the hospital, a list of the current medications will be sent to the facility for the patient to continue.  When discharged from the skilled rehab facility, please have the facility set up the patient's Home Health Physical Therapy prior to being released. Also, the skilled facility will be responsible for providing the patient with their medications at time of release from the facility to include their pain medication, the muscle relaxants, and their blood thinner medication. If the patient is still at the rehab facility at time of the two week follow up appointment, the skilled rehab facility will also need to assist the patient in arranging follow up appointment in our office and any transportation needs. ° °MAKE SURE YOU:  °Understand these instructions.  °Get help right away if you are not doing well or get worse.  ° ° °Pick up stool softner and laxative for home use following surgery while on pain medications. °Do not submerge incision under water. °Please use good hand washing techniques while changing dressing each day. °May shower starting three days after surgery. °Please use a clean towel to pat the incision dry following  showers. °Continue to use ice for pain and swelling after surgery. °Do not use any lotions or creams on the incision until instructed by your surgeon. ° °Take Xarelto for two and a half more weeks following discharge from the hospital, then discontinue Xarelto. °Once the patient has completed the blood thinner regimen, then take a Baby 81 mg Aspirin daily for three   more weeks.    Information on my medicine - XARELTO (Rivaroxaban)  This medication education was reviewed with me or my healthcare representative as part of my discharge preparation.  The pharmacist that spoke with me during my hospital stay was:  Ledell NossKelsey Mueller, Student-PharmD  Why was Xarelto prescribed for you? Xarelto was prescribed for you to reduce the risk of blood clots forming after orthopedic surgery. The medical term for these abnormal blood clots is venous thromboembolism (VTE).  What do you need to know about xarelto ? Take your Xarelto ONCE DAILY at the same time every day. You may take it either with or without food.  If you have difficulty swallowing the tablet whole, you may crush it and mix in applesauce just prior to taking your dose.  Take Xarelto exactly as prescribed by your doctor and DO NOT stop taking Xarelto without talking to the doctor who prescribed the medication.  Stopping without other VTE prevention medication to take the place of Xarelto may increase your risk of developing a clot.  After discharge, you should have regular check-up appointments with your healthcare provider that is prescribing your Xarelto.    What do you do if you miss a dose? If you miss a dose, take it as soon as you remember on the same day then continue your regularly scheduled once daily regimen the next day. Do not take two doses of Xarelto on the same day.   Important Safety Information A possible side effect of Xarelto is bleeding. You should call your healthcare provider right away if you experience any of the  following: ? Bleeding from an injury or your nose that does not stop. ? Unusual colored urine (red or dark brown) or unusual colored stools (red or black). ? Unusual bruising for unknown reasons. ? A serious fall or if you hit your head (even if there is no bleeding).  Some medicines may interact with Xarelto and might increase your risk of bleeding while on Xarelto. To help avoid this, consult your healthcare provider or pharmacist prior to using any new prescription or non-prescription medications, including herbals, vitamins, non-steroidal anti-inflammatory drugs (NSAIDs) and supplements.  This website has more information on Xarelto: [email or web address].

## 2017-04-19 LAB — CBC
HEMATOCRIT: 35.5 % — AB (ref 36.0–46.0)
HEMOGLOBIN: 11.9 g/dL — AB (ref 12.0–15.0)
MCH: 29.6 pg (ref 26.0–34.0)
MCHC: 33.5 g/dL (ref 30.0–36.0)
MCV: 88.3 fL (ref 78.0–100.0)
Platelets: 176 10*3/uL (ref 150–400)
RBC: 4.02 MIL/uL (ref 3.87–5.11)
RDW: 13.9 % (ref 11.5–15.5)
WBC: 17.5 10*3/uL — AB (ref 4.0–10.5)

## 2017-04-19 LAB — BASIC METABOLIC PANEL
ANION GAP: 5 (ref 5–15)
BUN: 17 mg/dL (ref 6–20)
CHLORIDE: 111 mmol/L (ref 101–111)
CO2: 28 mmol/L (ref 22–32)
Calcium: 8.6 mg/dL — ABNORMAL LOW (ref 8.9–10.3)
Creatinine, Ser: 0.82 mg/dL (ref 0.44–1.00)
GFR calc non Af Amer: >60 mL/min (ref >60)
Glucose, Bld: 127 mg/dL — ABNORMAL HIGH (ref 65–99)
POTASSIUM: 3.9 mmol/L (ref 3.5–5.1)
SODIUM: 144 mmol/L (ref 135–145)

## 2017-04-19 NOTE — Progress Notes (Signed)
   Subjective: 2 Days Post-Op Procedure(s) (LRB): RIGHT TOTAL KNEE ARTHROPLASTY (Right) Patient reports pain as mild.   Patient seen in rounds for Dr. [name]. Patient is well, but has had some minor complaints of pain in the knee, requiring pain medications Patient is ready to go home  Objective: Vital signs in last 24 hours: Temp:  [97.4 F (36.3 C)-98.3 F (36.8 C)] 97.7 F (36.5 C) (09/12 0550) Pulse Rate:  [56-72] 60 (09/12 0550) Resp:  [16-60] 60 (09/12 0550) BP: (112-139)/(52-61) 139/56 (09/12 0550) SpO2:  [94 %-98 %] 98 % (09/12 0550)  Intake/Output from previous day:  Intake/Output Summary (Last 24 hours) at 04/19/17 0850 Last data filed at 04/19/17 0600  Gross per 24 hour  Intake           2352.5 ml  Output             1700 ml  Net            652.5 ml    Intake/Output this shift: No intake/output data recorded.  Labs:  Recent Labs  04/18/17 0501 04/19/17 0539  HGB 12.8 11.9*    Recent Labs  04/18/17 0501 04/19/17 0539  WBC 10.4 17.5*  RBC 4.38 4.02  HCT 38.6 35.5*  PLT 152 176    Recent Labs  04/18/17 0501 04/19/17 0539  NA 141 144  K 4.1 3.9  CL 107 111  CO2 25 28  BUN 10 17  CREATININE 0.77 0.82  GLUCOSE 159* 127*  CALCIUM 8.4* 8.6*   No results for input(s): LABPT, INR in the last 72 hours.  EXAM: General - Patient is Alert, Appropriate and Oriented Extremity - Neurovascular intact Sensation intact distally Intact pulses distally Dorsiflexion/Plantar flexion intact Incision - clean, dry, no drainage Motor Function - intact, moving foot and toes well on exam.   Assessment/Plan: 2 Days Post-Op Procedure(s) (LRB): RIGHT TOTAL KNEE ARTHROPLASTY (Right) Procedure(s) (LRB): RIGHT TOTAL KNEE ARTHROPLASTY (Right) Past Medical History:  Diagnosis Date  . Abnormal CT scan, kidney    Mild prominence of the L kidney upper pole collecting system versus parapelvic cyst on CT 11/2011  . Abnormal TSH    11/2011  . Bicuspid aortic valve     Mild AS by echo 11/2011  . HTN (hypertension)   . NSTEMI (non-ST elevated myocardial infarction) (HCC)    Troponin of 11 11/2011 and cath 11/20/11 without evidence for significant obstructive CAD - ?etiology - no PE by CTA, question secondary to atheroemboli  . Pericardial effusion    Small, 11/2011   Principal Problem:   OA (osteoarthritis) of knee  Estimated body mass index is 29.29 kg/m as calculated from the following:   Height as of this encounter: 5\' 5"  (1.651 m).   Weight as of this encounter: 79.8 kg (176 lb). Up with therapy Discharge home - straight to outpatient Diet - Cardiac diet Follow up - in 2 weeks Activity - WBAT Disposition - Home Condition Upon Discharge - Stable D/C Meds - See DC Summary DVT Prophylaxis - Xarelto  Madison Peacerew Zaedyn Covin, PA-C Orthopaedic Surgery 04/19/2017, 8:50 AM

## 2017-04-19 NOTE — Progress Notes (Signed)
Physical Therapy Treatment Patient Details Name: Madison Ramos MRN: [id number] DOB: 12-07-1940 Today's Date: 04/19/2017    History of Present Illness Pt is a 76 year old female s/p R TKA    PT Comments    Pt ambulated in hallway and performed LE exercises.  Pt provided with HEP handout and had no further questions.  Pt feels ready for d/c home today.  Follow Up Recommendations  Outpatient PT;DC plan and follow up therapy as arranged by surgeon     Equipment Recommendations  None recommended by PT    Recommendations for Other Services       Precautions / Restrictions Precautions Precautions: Fall;Knee Restrictions Other Position/Activity Restrictions: WBAT    Mobility  Bed Mobility Overal bed mobility: Needs Assistance Bed Mobility: Supine to Sit;Sit to Supine     Supine to sit: Supervision Sit to supine: Supervision      Transfers Overall transfer level: Needs assistance Equipment used: Rolling walker (2 wheeled) Transfers: Sit to/from Stand Sit to Stand: Supervision         General transfer comment: verbal cues for UE and LE positioning  Ambulation/Gait Ambulation/Gait assistance: Min guard Ambulation Distance (Feet): 200 Feet Assistive device: Rolling walker (2 wheeled) Gait Pattern/deviations: Step-to pattern;Decreased stance time - right;Antalgic     General Gait Details: verbal cues for RW positioning, posture, step length, pt reports more pain today however tolerated distance well   Stairs            Wheelchair Mobility    Modified Rankin (Stroke Patients Only)       Balance                                            Cognition Arousal/Alertness: Awake/alert Behavior During Therapy: WFL for tasks assessed/performed Overall Cognitive Status: Within Functional Limits for tasks assessed                                        Exercises Total Joint Exercises Ankle Circles/Pumps: AROM;Both;10  reps Quad Sets: AROM;10 reps;Both Gluteal Sets: AROM;Both;10 reps Short Arc Quad: AROM;Right;10 reps Heel Slides: AAROM;Right;10 reps Hip ABduction/ADduction: AROM;Right;10 reps Straight Leg Raises: Right;10 reps;AAROM    General Comments        Pertinent Vitals/Pain Pain Assessment: 0-10 Pain Score: 6  Pain Descriptors / Indicators: Aching;Sore Pain Intervention(s): Limited activity within patient's tolerance;Monitored during session;Repositioned;Premedicated before session    Home Living                      Prior Function            PT Goals (current goals can now be found in the care plan section) Progress towards PT goals: Progressing toward goals    Frequency    7X/week      PT Plan Current plan remains appropriate    Co-evaluation              AM-PAC PT "6 Clicks" Daily Activity  Outcome Measure  Difficulty turning over in bed (including adjusting bedclothes, sheets and blankets)?: None Difficulty moving from lying on back to sitting on the side of the bed? : None Difficulty sitting down on and standing up from a chair with arms (e.g., wheelchair, bedside commode, etc,.)?: A Little Help needed  moving to and from a bed to chair (including a wheelchair)?: A Little Help needed walking in hospital room?: A Little Help needed climbing 3-5 steps with a railing? : A Little 6 Click Score: 20    End of Session   Activity Tolerance: Patient tolerated treatment well Patient left: with call bell/phone within reach;in bed   PT Visit Diagnosis: Difficulty in walking, not elsewhere classified (R26.2)     Time: 4098-1191 PT Time Calculation (min) (ACUTE ONLY): 24 min  Charges:  $Gait Training: 8-22 mins $Therapeutic Exercise: 8-22 mins                    G Codes:       {Kati Kairon Shock, PT, DPT 04/19/2017 Pager: [phone]  Maida Sale E 04/19/2017, 2:53 PM

## 2017-04-19 NOTE — Progress Notes (Signed)
Discharge instructions given to patient and husband. Questions ask and answered D Susann GivensFranklin RN

## 2017-04-20 DIAGNOSIS — Z96651 Presence of right artificial knee joint: Secondary | ICD-10-CM | POA: Diagnosis not present

## 2017-04-20 DIAGNOSIS — M1711 Unilateral primary osteoarthritis, right knee: Secondary | ICD-10-CM | POA: Diagnosis not present

## 2017-04-20 NOTE — Addendum Note (Signed)
Addendum  created 04/20/17 [id number] by Dorris SinghGreen, Lucrecia Mcphearson, MD   Anesthesia Intra Blocks edited, Sign clinical note

## 2017-04-25 DIAGNOSIS — Z96651 Presence of right artificial knee joint: Secondary | ICD-10-CM | POA: Diagnosis not present

## 2017-04-25 DIAGNOSIS — M1711 Unilateral primary osteoarthritis, right knee: Secondary | ICD-10-CM | POA: Diagnosis not present

## 2017-04-27 DIAGNOSIS — M1711 Unilateral primary osteoarthritis, right knee: Secondary | ICD-10-CM | POA: Diagnosis not present

## 2017-04-27 DIAGNOSIS — Z96651 Presence of right artificial knee joint: Secondary | ICD-10-CM | POA: Diagnosis not present

## 2017-05-01 DIAGNOSIS — M1711 Unilateral primary osteoarthritis, right knee: Secondary | ICD-10-CM | POA: Diagnosis not present

## 2017-05-01 DIAGNOSIS — Z96651 Presence of right artificial knee joint: Secondary | ICD-10-CM | POA: Diagnosis not present

## 2017-05-04 DIAGNOSIS — Z96651 Presence of right artificial knee joint: Secondary | ICD-10-CM | POA: Diagnosis not present

## 2017-05-04 DIAGNOSIS — M1711 Unilateral primary osteoarthritis, right knee: Secondary | ICD-10-CM | POA: Diagnosis not present

## 2017-05-09 DIAGNOSIS — Z96651 Presence of right artificial knee joint: Secondary | ICD-10-CM | POA: Diagnosis not present

## 2017-05-09 DIAGNOSIS — M1711 Unilateral primary osteoarthritis, right knee: Secondary | ICD-10-CM | POA: Diagnosis not present

## 2017-05-11 DIAGNOSIS — M1711 Unilateral primary osteoarthritis, right knee: Secondary | ICD-10-CM | POA: Diagnosis not present

## 2017-05-11 DIAGNOSIS — Z96651 Presence of right artificial knee joint: Secondary | ICD-10-CM | POA: Diagnosis not present

## 2017-05-16 DIAGNOSIS — M1711 Unilateral primary osteoarthritis, right knee: Secondary | ICD-10-CM | POA: Diagnosis not present

## 2017-05-16 DIAGNOSIS — Z96651 Presence of right artificial knee joint: Secondary | ICD-10-CM | POA: Diagnosis not present

## 2017-05-18 DIAGNOSIS — Z96651 Presence of right artificial knee joint: Secondary | ICD-10-CM | POA: Diagnosis not present

## 2017-05-18 DIAGNOSIS — M1711 Unilateral primary osteoarthritis, right knee: Secondary | ICD-10-CM | POA: Diagnosis not present

## 2017-05-23 DIAGNOSIS — Z96651 Presence of right artificial knee joint: Secondary | ICD-10-CM | POA: Diagnosis not present

## 2017-05-23 DIAGNOSIS — M1711 Unilateral primary osteoarthritis, right knee: Secondary | ICD-10-CM | POA: Diagnosis not present

## 2017-05-25 DIAGNOSIS — Z96651 Presence of right artificial knee joint: Secondary | ICD-10-CM | POA: Diagnosis not present

## 2017-05-25 DIAGNOSIS — M1711 Unilateral primary osteoarthritis, right knee: Secondary | ICD-10-CM | POA: Diagnosis not present

## 2017-05-26 DIAGNOSIS — Z96651 Presence of right artificial knee joint: Secondary | ICD-10-CM | POA: Diagnosis not present

## 2017-05-26 DIAGNOSIS — Z471 Aftercare following joint replacement surgery: Secondary | ICD-10-CM | POA: Diagnosis not present

## 2017-05-30 DIAGNOSIS — M1711 Unilateral primary osteoarthritis, right knee: Secondary | ICD-10-CM | POA: Diagnosis not present

## 2017-05-30 DIAGNOSIS — Z96651 Presence of right artificial knee joint: Secondary | ICD-10-CM | POA: Diagnosis not present

## 2017-06-01 DIAGNOSIS — Z96651 Presence of right artificial knee joint: Secondary | ICD-10-CM | POA: Diagnosis not present

## 2017-06-01 DIAGNOSIS — M1711 Unilateral primary osteoarthritis, right knee: Secondary | ICD-10-CM | POA: Diagnosis not present

## 2017-06-05 DIAGNOSIS — I10 Essential (primary) hypertension: Secondary | ICD-10-CM | POA: Diagnosis not present

## 2017-06-05 DIAGNOSIS — E7800 Pure hypercholesterolemia, unspecified: Secondary | ICD-10-CM | POA: Diagnosis not present

## 2017-06-05 DIAGNOSIS — Z0000 Encounter for general adult medical examination without abnormal findings: Secondary | ICD-10-CM | POA: Diagnosis not present

## 2017-06-05 DIAGNOSIS — E663 Overweight: Secondary | ICD-10-CM | POA: Diagnosis not present

## 2017-06-05 DIAGNOSIS — Z6829 Body mass index (BMI) 29.0-29.9, adult: Secondary | ICD-10-CM | POA: Diagnosis not present

## 2017-06-05 DIAGNOSIS — Z23 Encounter for immunization: Secondary | ICD-10-CM | POA: Diagnosis not present

## 2017-06-05 DIAGNOSIS — E559 Vitamin D deficiency, unspecified: Secondary | ICD-10-CM | POA: Diagnosis not present

## 2017-06-06 DIAGNOSIS — Z96651 Presence of right artificial knee joint: Secondary | ICD-10-CM | POA: Diagnosis not present

## 2017-06-06 DIAGNOSIS — M1711 Unilateral primary osteoarthritis, right knee: Secondary | ICD-10-CM | POA: Diagnosis not present

## 2017-06-08 DIAGNOSIS — Z96651 Presence of right artificial knee joint: Secondary | ICD-10-CM | POA: Diagnosis not present

## 2017-06-08 DIAGNOSIS — M1711 Unilateral primary osteoarthritis, right knee: Secondary | ICD-10-CM | POA: Diagnosis not present

## 2017-06-13 DIAGNOSIS — M1711 Unilateral primary osteoarthritis, right knee: Secondary | ICD-10-CM | POA: Diagnosis not present

## 2017-06-13 DIAGNOSIS — Z96651 Presence of right artificial knee joint: Secondary | ICD-10-CM | POA: Diagnosis not present

## 2017-07-04 DIAGNOSIS — H26493 Other secondary cataract, bilateral: Secondary | ICD-10-CM | POA: Diagnosis not present

## 2017-07-04 DIAGNOSIS — H524 Presbyopia: Secondary | ICD-10-CM | POA: Diagnosis not present

## 2017-12-28 DIAGNOSIS — Z79899 Other long term (current) drug therapy: Secondary | ICD-10-CM | POA: Diagnosis not present

## 2017-12-28 DIAGNOSIS — I10 Essential (primary) hypertension: Secondary | ICD-10-CM | POA: Diagnosis not present

## 2017-12-28 DIAGNOSIS — Z6829 Body mass index (BMI) 29.0-29.9, adult: Secondary | ICD-10-CM | POA: Diagnosis not present

## 2017-12-28 DIAGNOSIS — E7800 Pure hypercholesterolemia, unspecified: Secondary | ICD-10-CM | POA: Diagnosis not present

## 2017-12-28 DIAGNOSIS — E663 Overweight: Secondary | ICD-10-CM | POA: Diagnosis not present

## 2017-12-29 DIAGNOSIS — E7800 Pure hypercholesterolemia, unspecified: Secondary | ICD-10-CM | POA: Diagnosis not present

## 2017-12-29 DIAGNOSIS — Z79899 Other long term (current) drug therapy: Secondary | ICD-10-CM | POA: Diagnosis not present

## 2018-06-28 DIAGNOSIS — Z471 Aftercare following joint replacement surgery: Secondary | ICD-10-CM | POA: Diagnosis not present

## 2018-06-28 DIAGNOSIS — Z96659 Presence of unspecified artificial knee joint: Secondary | ICD-10-CM | POA: Diagnosis not present

## 2018-06-28 DIAGNOSIS — Z96651 Presence of right artificial knee joint: Secondary | ICD-10-CM | POA: Diagnosis not present

## 2018-07-02 DIAGNOSIS — N959 Unspecified menopausal and perimenopausal disorder: Secondary | ICD-10-CM | POA: Diagnosis not present

## 2018-07-02 DIAGNOSIS — Z79899 Other long term (current) drug therapy: Secondary | ICD-10-CM | POA: Diagnosis not present

## 2018-07-02 DIAGNOSIS — I10 Essential (primary) hypertension: Secondary | ICD-10-CM | POA: Diagnosis not present

## 2018-07-02 DIAGNOSIS — E559 Vitamin D deficiency, unspecified: Secondary | ICD-10-CM | POA: Diagnosis not present

## 2018-07-02 DIAGNOSIS — Z23 Encounter for immunization: Secondary | ICD-10-CM | POA: Diagnosis not present

## 2018-07-02 DIAGNOSIS — Z0000 Encounter for general adult medical examination without abnormal findings: Secondary | ICD-10-CM | POA: Diagnosis not present

## 2018-07-02 DIAGNOSIS — E7800 Pure hypercholesterolemia, unspecified: Secondary | ICD-10-CM | POA: Diagnosis not present

## 2018-07-10 DIAGNOSIS — H26493 Other secondary cataract, bilateral: Secondary | ICD-10-CM | POA: Diagnosis not present

## 2018-08-10 DIAGNOSIS — Z1231 Encounter for screening mammogram for malignant neoplasm of breast: Secondary | ICD-10-CM | POA: Diagnosis not present

## 2018-08-10 DIAGNOSIS — M85851 Other specified disorders of bone density and structure, right thigh: Secondary | ICD-10-CM | POA: Diagnosis not present

## 2018-08-10 DIAGNOSIS — M8589 Other specified disorders of bone density and structure, multiple sites: Secondary | ICD-10-CM | POA: Diagnosis not present

## 2018-10-23 DIAGNOSIS — E559 Vitamin D deficiency, unspecified: Secondary | ICD-10-CM | POA: Diagnosis not present

## 2018-10-23 DIAGNOSIS — I10 Essential (primary) hypertension: Secondary | ICD-10-CM | POA: Diagnosis not present

## 2018-10-23 DIAGNOSIS — Z6830 Body mass index (BMI) 30.0-30.9, adult: Secondary | ICD-10-CM | POA: Diagnosis not present

## 2018-10-23 DIAGNOSIS — E7800 Pure hypercholesterolemia, unspecified: Secondary | ICD-10-CM | POA: Diagnosis not present

## 2018-10-23 DIAGNOSIS — E669 Obesity, unspecified: Secondary | ICD-10-CM | POA: Diagnosis not present

## 2019-01-23 DIAGNOSIS — I10 Essential (primary) hypertension: Secondary | ICD-10-CM | POA: Diagnosis not present

## 2019-01-23 DIAGNOSIS — E559 Vitamin D deficiency, unspecified: Secondary | ICD-10-CM | POA: Diagnosis not present

## 2019-01-23 DIAGNOSIS — E7800 Pure hypercholesterolemia, unspecified: Secondary | ICD-10-CM | POA: Diagnosis not present

## 2019-05-28 DIAGNOSIS — H547 Unspecified visual loss: Secondary | ICD-10-CM | POA: Diagnosis not present

## 2019-05-28 DIAGNOSIS — E785 Hyperlipidemia, unspecified: Secondary | ICD-10-CM | POA: Diagnosis not present

## 2019-05-28 DIAGNOSIS — Z6826 Body mass index (BMI) 26.0-26.9, adult: Secondary | ICD-10-CM | POA: Diagnosis not present

## 2019-05-28 DIAGNOSIS — E669 Obesity, unspecified: Secondary | ICD-10-CM | POA: Diagnosis not present

## 2019-05-28 DIAGNOSIS — M170 Bilateral primary osteoarthritis of knee: Secondary | ICD-10-CM | POA: Diagnosis not present

## 2019-05-28 DIAGNOSIS — I10 Essential (primary) hypertension: Secondary | ICD-10-CM | POA: Diagnosis not present

## 2019-05-28 DIAGNOSIS — E559 Vitamin D deficiency, unspecified: Secondary | ICD-10-CM | POA: Diagnosis not present

## 2019-07-11 DIAGNOSIS — Z2821 Immunization not carried out because of patient refusal: Secondary | ICD-10-CM | POA: Diagnosis not present

## 2019-07-11 DIAGNOSIS — Z6830 Body mass index (BMI) 30.0-30.9, adult: Secondary | ICD-10-CM | POA: Diagnosis not present

## 2019-07-11 DIAGNOSIS — E669 Obesity, unspecified: Secondary | ICD-10-CM | POA: Diagnosis not present

## 2019-07-11 DIAGNOSIS — E559 Vitamin D deficiency, unspecified: Secondary | ICD-10-CM | POA: Diagnosis not present

## 2019-07-11 DIAGNOSIS — I10 Essential (primary) hypertension: Secondary | ICD-10-CM | POA: Diagnosis not present

## 2019-07-11 DIAGNOSIS — Z0000 Encounter for general adult medical examination without abnormal findings: Secondary | ICD-10-CM | POA: Diagnosis not present

## 2019-07-11 DIAGNOSIS — E7800 Pure hypercholesterolemia, unspecified: Secondary | ICD-10-CM | POA: Diagnosis not present

## 2019-08-13 DIAGNOSIS — Z1231 Encounter for screening mammogram for malignant neoplasm of breast: Secondary | ICD-10-CM | POA: Diagnosis not present

## 2020-01-10 DIAGNOSIS — E669 Obesity, unspecified: Secondary | ICD-10-CM | POA: Diagnosis not present

## 2020-01-10 DIAGNOSIS — I10 Essential (primary) hypertension: Secondary | ICD-10-CM | POA: Diagnosis not present

## 2020-01-10 DIAGNOSIS — Z6830 Body mass index (BMI) 30.0-30.9, adult: Secondary | ICD-10-CM | POA: Diagnosis not present

## 2020-01-10 DIAGNOSIS — E7800 Pure hypercholesterolemia, unspecified: Secondary | ICD-10-CM | POA: Diagnosis not present

## 2020-07-16 DIAGNOSIS — Z23 Encounter for immunization: Secondary | ICD-10-CM | POA: Diagnosis not present

## 2020-07-16 DIAGNOSIS — E7800 Pure hypercholesterolemia, unspecified: Secondary | ICD-10-CM | POA: Diagnosis not present

## 2020-07-16 DIAGNOSIS — I10 Essential (primary) hypertension: Secondary | ICD-10-CM | POA: Diagnosis not present

## 2020-07-16 DIAGNOSIS — Z79899 Other long term (current) drug therapy: Secondary | ICD-10-CM | POA: Diagnosis not present

## 2020-07-16 DIAGNOSIS — Z0000 Encounter for general adult medical examination without abnormal findings: Secondary | ICD-10-CM | POA: Diagnosis not present

## 2020-07-16 DIAGNOSIS — E663 Overweight: Secondary | ICD-10-CM | POA: Diagnosis not present

## 2020-07-16 DIAGNOSIS — Z6829 Body mass index (BMI) 29.0-29.9, adult: Secondary | ICD-10-CM | POA: Diagnosis not present

## 2020-07-16 DIAGNOSIS — M8589 Other specified disorders of bone density and structure, multiple sites: Secondary | ICD-10-CM | POA: Diagnosis not present

## 2020-08-21 DIAGNOSIS — Z1231 Encounter for screening mammogram for malignant neoplasm of breast: Secondary | ICD-10-CM | POA: Diagnosis not present

## 2020-09-01 DIAGNOSIS — L02231 Carbuncle of abdominal wall: Secondary | ICD-10-CM | POA: Diagnosis not present

## 2021-01-14 DIAGNOSIS — E7800 Pure hypercholesterolemia, unspecified: Secondary | ICD-10-CM | POA: Diagnosis not present

## 2021-01-14 DIAGNOSIS — Z79899 Other long term (current) drug therapy: Secondary | ICD-10-CM | POA: Diagnosis not present

## 2021-01-14 DIAGNOSIS — I10 Essential (primary) hypertension: Secondary | ICD-10-CM | POA: Diagnosis not present

## 2021-01-29 DIAGNOSIS — J069 Acute upper respiratory infection, unspecified: Secondary | ICD-10-CM | POA: Diagnosis not present

## 2021-01-29 DIAGNOSIS — R051 Acute cough: Secondary | ICD-10-CM | POA: Diagnosis not present

## 2021-02-16 DIAGNOSIS — N12 Tubulo-interstitial nephritis, not specified as acute or chronic: Secondary | ICD-10-CM | POA: Diagnosis not present

## 2021-02-16 DIAGNOSIS — R509 Fever, unspecified: Secondary | ICD-10-CM | POA: Diagnosis not present

## 2021-02-16 DIAGNOSIS — R5381 Other malaise: Secondary | ICD-10-CM | POA: Diagnosis not present

## 2021-02-16 DIAGNOSIS — Z792 Long term (current) use of antibiotics: Secondary | ICD-10-CM | POA: Diagnosis not present

## 2021-02-16 DIAGNOSIS — R7989 Other specified abnormal findings of blood chemistry: Secondary | ICD-10-CM | POA: Diagnosis not present

## 2021-02-16 DIAGNOSIS — J9601 Acute respiratory failure with hypoxia: Secondary | ICD-10-CM | POA: Diagnosis not present

## 2021-02-16 DIAGNOSIS — R791 Abnormal coagulation profile: Secondary | ICD-10-CM | POA: Diagnosis not present

## 2021-02-16 DIAGNOSIS — R54 Age-related physical debility: Secondary | ICD-10-CM | POA: Diagnosis not present

## 2021-02-16 DIAGNOSIS — I351 Nonrheumatic aortic (valve) insufficiency: Secondary | ICD-10-CM | POA: Diagnosis not present

## 2021-02-16 DIAGNOSIS — U071 COVID-19: Secondary | ICD-10-CM | POA: Diagnosis not present

## 2021-02-16 DIAGNOSIS — R296 Repeated falls: Secondary | ICD-10-CM | POA: Diagnosis not present

## 2021-02-16 DIAGNOSIS — Z79899 Other long term (current) drug therapy: Secondary | ICD-10-CM | POA: Diagnosis not present

## 2021-02-16 DIAGNOSIS — I69354 Hemiplegia and hemiparesis following cerebral infarction affecting left non-dominant side: Secondary | ICD-10-CM | POA: Diagnosis not present

## 2021-02-16 DIAGNOSIS — R8290 Unspecified abnormal findings in urine: Secondary | ICD-10-CM | POA: Diagnosis not present

## 2021-02-16 DIAGNOSIS — R404 Transient alteration of awareness: Secondary | ICD-10-CM | POA: Diagnosis not present

## 2021-02-16 DIAGNOSIS — R0902 Hypoxemia: Secondary | ICD-10-CM | POA: Diagnosis not present

## 2021-02-16 DIAGNOSIS — R531 Weakness: Secondary | ICD-10-CM | POA: Diagnosis not present

## 2021-02-16 DIAGNOSIS — E860 Dehydration: Secondary | ICD-10-CM | POA: Diagnosis not present

## 2021-02-16 DIAGNOSIS — E876 Hypokalemia: Secondary | ICD-10-CM | POA: Diagnosis not present

## 2021-02-16 DIAGNOSIS — N390 Urinary tract infection, site not specified: Secondary | ICD-10-CM | POA: Diagnosis not present

## 2021-02-16 DIAGNOSIS — R0602 Shortness of breath: Secondary | ICD-10-CM | POA: Diagnosis not present

## 2021-02-16 DIAGNOSIS — Z1611 Resistance to penicillins: Secondary | ICD-10-CM | POA: Diagnosis not present

## 2021-02-16 DIAGNOSIS — E785 Hyperlipidemia, unspecified: Secondary | ICD-10-CM | POA: Diagnosis not present

## 2021-02-16 DIAGNOSIS — I10 Essential (primary) hypertension: Secondary | ICD-10-CM | POA: Diagnosis not present

## 2021-02-16 DIAGNOSIS — I252 Old myocardial infarction: Secondary | ICD-10-CM | POA: Diagnosis not present

## 2021-02-16 DIAGNOSIS — A4151 Sepsis due to Escherichia coli [E. coli]: Secondary | ICD-10-CM | POA: Diagnosis not present

## 2021-02-16 DIAGNOSIS — A419 Sepsis, unspecified organism: Secondary | ICD-10-CM | POA: Diagnosis not present

## 2021-03-02 DIAGNOSIS — A4151 Sepsis due to Escherichia coli [E. coli]: Secondary | ICD-10-CM | POA: Diagnosis not present

## 2021-03-04 DIAGNOSIS — E785 Hyperlipidemia, unspecified: Secondary | ICD-10-CM | POA: Diagnosis not present

## 2021-03-04 DIAGNOSIS — E162 Hypoglycemia, unspecified: Secondary | ICD-10-CM | POA: Diagnosis not present

## 2021-03-04 DIAGNOSIS — Z8619 Personal history of other infectious and parasitic diseases: Secondary | ICD-10-CM | POA: Diagnosis not present

## 2021-03-04 DIAGNOSIS — Z87440 Personal history of urinary (tract) infections: Secondary | ICD-10-CM | POA: Diagnosis not present

## 2021-03-04 DIAGNOSIS — N189 Chronic kidney disease, unspecified: Secondary | ICD-10-CM | POA: Diagnosis not present

## 2021-03-04 DIAGNOSIS — E870 Hyperosmolality and hypernatremia: Secondary | ICD-10-CM | POA: Diagnosis not present

## 2021-03-04 DIAGNOSIS — N1330 Unspecified hydronephrosis: Secondary | ICD-10-CM | POA: Diagnosis not present

## 2021-03-04 DIAGNOSIS — N170 Acute kidney failure with tubular necrosis: Secondary | ICD-10-CM | POA: Diagnosis not present

## 2021-03-04 DIAGNOSIS — I959 Hypotension, unspecified: Secondary | ICD-10-CM | POA: Diagnosis not present

## 2021-03-04 DIAGNOSIS — N390 Urinary tract infection, site not specified: Secondary | ICD-10-CM | POA: Diagnosis not present

## 2021-03-04 DIAGNOSIS — R7881 Bacteremia: Secondary | ICD-10-CM | POA: Diagnosis not present

## 2021-03-04 DIAGNOSIS — R5383 Other fatigue: Secondary | ICD-10-CM | POA: Diagnosis not present

## 2021-03-04 DIAGNOSIS — I10 Essential (primary) hypertension: Secondary | ICD-10-CM | POA: Diagnosis not present

## 2021-03-04 DIAGNOSIS — U071 COVID-19: Secondary | ICD-10-CM | POA: Diagnosis not present

## 2021-03-04 DIAGNOSIS — Z79899 Other long term (current) drug therapy: Secondary | ICD-10-CM | POA: Diagnosis not present

## 2021-03-04 DIAGNOSIS — N179 Acute kidney failure, unspecified: Secondary | ICD-10-CM | POA: Diagnosis not present

## 2021-03-04 DIAGNOSIS — T508X5A Adverse effect of diagnostic agents, initial encounter: Secondary | ICD-10-CM | POA: Diagnosis not present

## 2021-03-04 DIAGNOSIS — R0902 Hypoxemia: Secondary | ICD-10-CM | POA: Diagnosis not present

## 2021-03-04 DIAGNOSIS — I129 Hypertensive chronic kidney disease with stage 1 through stage 4 chronic kidney disease, or unspecified chronic kidney disease: Secondary | ICD-10-CM | POA: Diagnosis not present

## 2021-03-04 DIAGNOSIS — R109 Unspecified abdominal pain: Secondary | ICD-10-CM | POA: Diagnosis not present

## 2021-03-04 DIAGNOSIS — R809 Proteinuria, unspecified: Secondary | ICD-10-CM | POA: Diagnosis not present

## 2021-03-04 DIAGNOSIS — R799 Abnormal finding of blood chemistry, unspecified: Secondary | ICD-10-CM | POA: Diagnosis not present

## 2021-03-04 DIAGNOSIS — R002 Palpitations: Secondary | ICD-10-CM | POA: Diagnosis not present

## 2021-03-04 DIAGNOSIS — R630 Anorexia: Secondary | ICD-10-CM | POA: Diagnosis not present

## 2021-03-04 DIAGNOSIS — N141 Nephropathy induced by other drugs, medicaments and biological substances: Secondary | ICD-10-CM | POA: Diagnosis not present

## 2021-03-04 DIAGNOSIS — I700 Atherosclerosis of aorta: Secondary | ICD-10-CM | POA: Diagnosis not present

## 2021-03-04 DIAGNOSIS — Z743 Need for continuous supervision: Secondary | ICD-10-CM | POA: Diagnosis not present

## 2021-03-04 DIAGNOSIS — K5730 Diverticulosis of large intestine without perforation or abscess without bleeding: Secondary | ICD-10-CM | POA: Diagnosis not present

## 2021-03-04 DIAGNOSIS — M549 Dorsalgia, unspecified: Secondary | ICD-10-CM | POA: Diagnosis not present

## 2021-03-04 DIAGNOSIS — Z7901 Long term (current) use of anticoagulants: Secondary | ICD-10-CM | POA: Diagnosis not present

## 2021-03-16 DIAGNOSIS — N179 Acute kidney failure, unspecified: Secondary | ICD-10-CM | POA: Diagnosis not present

## 2021-03-18 DIAGNOSIS — N179 Acute kidney failure, unspecified: Secondary | ICD-10-CM | POA: Diagnosis not present

## 2021-03-18 DIAGNOSIS — I700 Atherosclerosis of aorta: Secondary | ICD-10-CM | POA: Diagnosis not present

## 2021-07-19 DIAGNOSIS — Z23 Encounter for immunization: Secondary | ICD-10-CM | POA: Diagnosis not present

## 2021-07-19 DIAGNOSIS — E663 Overweight: Secondary | ICD-10-CM | POA: Diagnosis not present

## 2021-07-19 DIAGNOSIS — E7800 Pure hypercholesterolemia, unspecified: Secondary | ICD-10-CM | POA: Diagnosis not present

## 2021-07-19 DIAGNOSIS — Z79899 Other long term (current) drug therapy: Secondary | ICD-10-CM | POA: Diagnosis not present

## 2021-07-19 DIAGNOSIS — I10 Essential (primary) hypertension: Secondary | ICD-10-CM | POA: Diagnosis not present

## 2021-07-19 DIAGNOSIS — I499 Cardiac arrhythmia, unspecified: Secondary | ICD-10-CM | POA: Diagnosis not present

## 2021-07-19 DIAGNOSIS — M8589 Other specified disorders of bone density and structure, multiple sites: Secondary | ICD-10-CM | POA: Diagnosis not present

## 2021-07-19 DIAGNOSIS — I700 Atherosclerosis of aorta: Secondary | ICD-10-CM | POA: Diagnosis not present

## 2021-07-19 DIAGNOSIS — Z0000 Encounter for general adult medical examination without abnormal findings: Secondary | ICD-10-CM | POA: Diagnosis not present

## 2021-08-24 DIAGNOSIS — Z1231 Encounter for screening mammogram for malignant neoplasm of breast: Secondary | ICD-10-CM | POA: Diagnosis not present

## 2022-04-26 DIAGNOSIS — I10 Essential (primary) hypertension: Secondary | ICD-10-CM | POA: Diagnosis not present

## 2022-04-26 DIAGNOSIS — E7800 Pure hypercholesterolemia, unspecified: Secondary | ICD-10-CM | POA: Diagnosis not present

## 2022-07-08 DIAGNOSIS — Z1389 Encounter for screening for other disorder: Secondary | ICD-10-CM | POA: Diagnosis not present

## 2022-07-08 DIAGNOSIS — R9431 Abnormal electrocardiogram [ECG] [EKG]: Secondary | ICD-10-CM | POA: Diagnosis not present

## 2022-07-08 DIAGNOSIS — I700 Atherosclerosis of aorta: Secondary | ICD-10-CM | POA: Diagnosis not present

## 2022-07-08 DIAGNOSIS — Z96653 Presence of artificial knee joint, bilateral: Secondary | ICD-10-CM | POA: Diagnosis not present

## 2022-07-08 DIAGNOSIS — Z23 Encounter for immunization: Secondary | ICD-10-CM | POA: Diagnosis not present

## 2022-07-08 DIAGNOSIS — M8589 Other specified disorders of bone density and structure, multiple sites: Secondary | ICD-10-CM | POA: Diagnosis not present

## 2022-07-08 DIAGNOSIS — I10 Essential (primary) hypertension: Secondary | ICD-10-CM | POA: Diagnosis not present

## 2022-07-08 DIAGNOSIS — E663 Overweight: Secondary | ICD-10-CM | POA: Diagnosis not present

## 2022-07-08 DIAGNOSIS — Z0000 Encounter for general adult medical examination without abnormal findings: Secondary | ICD-10-CM | POA: Diagnosis not present

## 2022-10-25 DIAGNOSIS — E7800 Pure hypercholesterolemia, unspecified: Secondary | ICD-10-CM | POA: Diagnosis not present

## 2022-10-25 DIAGNOSIS — I10 Essential (primary) hypertension: Secondary | ICD-10-CM | POA: Diagnosis not present

## 2023-07-10 DIAGNOSIS — E7800 Pure hypercholesterolemia, unspecified: Secondary | ICD-10-CM | POA: Diagnosis not present

## 2023-07-10 DIAGNOSIS — I10 Essential (primary) hypertension: Secondary | ICD-10-CM | POA: Diagnosis not present

## 2023-07-10 DIAGNOSIS — I700 Atherosclerosis of aorta: Secondary | ICD-10-CM | POA: Diagnosis not present

## 2023-07-10 DIAGNOSIS — Z0000 Encounter for general adult medical examination without abnormal findings: Secondary | ICD-10-CM | POA: Diagnosis not present

## 2023-07-10 DIAGNOSIS — Z96653 Presence of artificial knee joint, bilateral: Secondary | ICD-10-CM | POA: Diagnosis not present

## 2023-07-10 DIAGNOSIS — E663 Overweight: Secondary | ICD-10-CM | POA: Diagnosis not present

## 2023-07-10 DIAGNOSIS — Z1389 Encounter for screening for other disorder: Secondary | ICD-10-CM | POA: Diagnosis not present

## 2023-07-10 DIAGNOSIS — Z23 Encounter for immunization: Secondary | ICD-10-CM | POA: Diagnosis not present

## 2023-07-10 DIAGNOSIS — Z6827 Body mass index (BMI) 27.0-27.9, adult: Secondary | ICD-10-CM | POA: Diagnosis not present

## 2023-09-11 DIAGNOSIS — Z8619 Personal history of other infectious and parasitic diseases: Secondary | ICD-10-CM | POA: Diagnosis not present

## 2023-09-11 DIAGNOSIS — N189 Chronic kidney disease, unspecified: Secondary | ICD-10-CM | POA: Diagnosis not present

## 2023-09-11 DIAGNOSIS — E785 Hyperlipidemia, unspecified: Secondary | ICD-10-CM | POA: Diagnosis not present

## 2023-09-11 DIAGNOSIS — I639 Cerebral infarction, unspecified: Secondary | ICD-10-CM | POA: Diagnosis not present

## 2023-09-11 DIAGNOSIS — I129 Hypertensive chronic kidney disease with stage 1 through stage 4 chronic kidney disease, or unspecified chronic kidney disease: Secondary | ICD-10-CM | POA: Diagnosis not present

## 2023-09-11 DIAGNOSIS — I2510 Atherosclerotic heart disease of native coronary artery without angina pectoris: Secondary | ICD-10-CM | POA: Diagnosis not present

## 2023-09-11 DIAGNOSIS — I771 Stricture of artery: Secondary | ICD-10-CM | POA: Diagnosis not present

## 2023-09-11 DIAGNOSIS — R200 Anesthesia of skin: Secondary | ICD-10-CM | POA: Diagnosis not present

## 2023-09-11 DIAGNOSIS — R4701 Aphasia: Secondary | ICD-10-CM | POA: Diagnosis not present

## 2023-09-11 DIAGNOSIS — I351 Nonrheumatic aortic (valve) insufficiency: Secondary | ICD-10-CM | POA: Diagnosis not present

## 2023-09-11 DIAGNOSIS — N179 Acute kidney failure, unspecified: Secondary | ICD-10-CM | POA: Diagnosis not present

## 2023-09-12 DIAGNOSIS — I639 Cerebral infarction, unspecified: Secondary | ICD-10-CM | POA: Diagnosis not present

## 2023-09-19 DIAGNOSIS — I6389 Other cerebral infarction: Secondary | ICD-10-CM | POA: Diagnosis not present

## 2023-09-19 DIAGNOSIS — Z8673 Personal history of transient ischemic attack (TIA), and cerebral infarction without residual deficits: Secondary | ICD-10-CM | POA: Diagnosis not present

## 2023-09-19 DIAGNOSIS — I10 Essential (primary) hypertension: Secondary | ICD-10-CM | POA: Diagnosis not present

## 2023-10-07 DIAGNOSIS — I639 Cerebral infarction, unspecified: Secondary | ICD-10-CM | POA: Diagnosis not present

## 2023-10-14 DIAGNOSIS — I639 Cerebral infarction, unspecified: Secondary | ICD-10-CM | POA: Diagnosis not present

## 2024-01-21 DIAGNOSIS — G459 Transient cerebral ischemic attack, unspecified: Secondary | ICD-10-CM | POA: Diagnosis not present

## 2024-01-21 DIAGNOSIS — I499 Cardiac arrhythmia, unspecified: Secondary | ICD-10-CM | POA: Diagnosis not present

## 2024-01-21 DIAGNOSIS — R410 Disorientation, unspecified: Secondary | ICD-10-CM | POA: Diagnosis not present

## 2024-01-21 DIAGNOSIS — E876 Hypokalemia: Secondary | ICD-10-CM | POA: Diagnosis not present

## 2024-01-21 DIAGNOSIS — R55 Syncope and collapse: Secondary | ICD-10-CM | POA: Diagnosis not present

## 2024-01-21 DIAGNOSIS — I491 Atrial premature depolarization: Secondary | ICD-10-CM | POA: Diagnosis not present

## 2024-01-21 DIAGNOSIS — J9811 Atelectasis: Secondary | ICD-10-CM | POA: Diagnosis not present

## 2024-01-21 DIAGNOSIS — E860 Dehydration: Secondary | ICD-10-CM | POA: Diagnosis not present

## 2024-01-21 DIAGNOSIS — N390 Urinary tract infection, site not specified: Secondary | ICD-10-CM | POA: Diagnosis not present

## 2024-01-21 DIAGNOSIS — R42 Dizziness and giddiness: Secondary | ICD-10-CM | POA: Diagnosis not present

## 2024-01-22 DIAGNOSIS — R55 Syncope and collapse: Secondary | ICD-10-CM | POA: Diagnosis not present

## 2024-01-22 DIAGNOSIS — N390 Urinary tract infection, site not specified: Secondary | ICD-10-CM | POA: Diagnosis not present

## 2024-01-22 DIAGNOSIS — E860 Dehydration: Secondary | ICD-10-CM | POA: Diagnosis not present

## 2024-01-22 DIAGNOSIS — I10 Essential (primary) hypertension: Secondary | ICD-10-CM | POA: Diagnosis not present

## 2024-01-22 DIAGNOSIS — E785 Hyperlipidemia, unspecified: Secondary | ICD-10-CM | POA: Diagnosis not present

## 2024-01-22 DIAGNOSIS — R001 Bradycardia, unspecified: Secondary | ICD-10-CM | POA: Diagnosis not present

## 2024-01-22 DIAGNOSIS — E876 Hypokalemia: Secondary | ICD-10-CM | POA: Diagnosis not present

## 2024-01-23 DIAGNOSIS — E876 Hypokalemia: Secondary | ICD-10-CM | POA: Diagnosis not present

## 2024-01-23 DIAGNOSIS — N390 Urinary tract infection, site not specified: Secondary | ICD-10-CM | POA: Diagnosis not present

## 2024-01-23 DIAGNOSIS — E860 Dehydration: Secondary | ICD-10-CM | POA: Diagnosis not present
